# Patient Record
Sex: Male | Born: 1950 | Race: White | Hispanic: No | Marital: Married | Smoking: Never smoker
Health system: Southern US, Community
[De-identification: ages and names within clinical notes are randomized; demographics above are authoritative.]

## PROBLEM LIST (undated history)

## (undated) DIAGNOSIS — Z87442 Personal history of urinary calculi: Secondary | ICD-10-CM

## (undated) DIAGNOSIS — N289 Disorder of kidney and ureter, unspecified: Secondary | ICD-10-CM

## (undated) DIAGNOSIS — I499 Cardiac arrhythmia, unspecified: Secondary | ICD-10-CM

## (undated) HISTORY — PX: LUMBAR LAMINECTOMY: SHX95

## (undated) HISTORY — PX: BACK SURGERY: SHX140

---

## 1998-10-09 ENCOUNTER — Encounter: Payer: Self-pay | Admitting: Urology

## 1998-10-09 ENCOUNTER — Observation Stay (HOSPITAL_COMMUNITY): Admission: EM | Admit: 1998-10-09 | Discharge: 1998-10-10 | Payer: Self-pay | Admitting: Emergency Medicine

## 1999-01-12 HISTORY — PX: STONE EXTRACTION WITH BASKET: SHX5318

## 2002-01-31 ENCOUNTER — Encounter: Admission: RE | Admit: 2002-01-31 | Discharge: 2002-01-31 | Payer: Self-pay | Admitting: Neurosurgery

## 2002-01-31 ENCOUNTER — Encounter: Payer: Self-pay | Admitting: Neurosurgery

## 2011-01-14 ENCOUNTER — Encounter: Payer: Self-pay | Admitting: Emergency Medicine

## 2011-01-14 ENCOUNTER — Emergency Department (HOSPITAL_COMMUNITY): Payer: BC Managed Care – PPO

## 2011-01-14 ENCOUNTER — Emergency Department (HOSPITAL_COMMUNITY)
Admission: EM | Admit: 2011-01-14 | Discharge: 2011-01-14 | Disposition: A | Discharge status: Home / Self Care | Payer: BC Managed Care – PPO | Attending: Emergency Medicine | Admitting: Emergency Medicine

## 2011-01-14 DIAGNOSIS — N200 Calculus of kidney: Secondary | ICD-10-CM

## 2011-01-14 DIAGNOSIS — N1330 Unspecified hydronephrosis: Secondary | ICD-10-CM | POA: Insufficient documentation

## 2011-01-14 DIAGNOSIS — R109 Unspecified abdominal pain: Secondary | ICD-10-CM | POA: Insufficient documentation

## 2011-01-14 LAB — URINALYSIS, ROUTINE W REFLEX MICROSCOPIC
Bilirubin Urine: NEGATIVE
Glucose, UA: NEGATIVE mg/dL
Hgb urine dipstick: NEGATIVE
Ketones, ur: NEGATIVE mg/dL
Leukocytes, UA: NEGATIVE
Nitrite: NEGATIVE
Protein, ur: NEGATIVE mg/dL
Specific Gravity, Urine: 1.029 (ref 1.005–1.030)
Urobilinogen, UA: 0.2 mg/dL (ref 0.0–1.0)
pH: 5 (ref 5.0–8.0)

## 2011-01-14 MED ORDER — ONDANSETRON HCL 4 MG/2ML IJ SOLN
4.0000 mg | Freq: Once | INTRAMUSCULAR | Status: AC
  Administered 2011-01-14: 4 mg via INTRAVENOUS
  Filled 2011-01-14: qty 2

## 2011-01-14 MED ORDER — OXYCODONE-ACETAMINOPHEN 5-325 MG PO TABS: 2.0000 | ORAL_TABLET | ORAL | Status: AC | PRN

## 2011-01-14 MED ORDER — KETOROLAC TROMETHAMINE 30 MG/ML IJ SOLN
30.0000 mg | Freq: Once | INTRAMUSCULAR | Status: AC
  Administered 2011-01-14: 30 mg via INTRAVENOUS
  Filled 2011-01-14: qty 1

## 2011-01-14 MED ORDER — SODIUM CHLORIDE 0.9 % IV SOLN
Freq: Once | INTRAVENOUS | Status: AC
  Administered 2011-01-14: 07:00:00 via INTRAVENOUS

## 2011-01-14 MED ORDER — TAMSULOSIN HCL 0.4 MG PO CAPS: 0.4000 mg | ORAL_CAPSULE | Freq: Every day | ORAL | Status: DC

## 2011-01-14 NOTE — ED Notes (Signed)
Pt alert and oriented x4. Respirations even and unlabored, bilateral symmetrical rise and fall of chest. Skin warm and dry. In no acute distress. Denies needs. Ambulatory and verbally reports understanding discharge instructions.

## 2011-01-14 NOTE — ED Notes (Signed)
Pt alert, nad, c/o left flank pain, onset last pm, pt stats pain in waves, denies bowel or bladder, resp even unlabored, skin pwd, emesis X1 pta

## 2011-01-14 NOTE — ED Notes (Signed)
Pt alert and oriented x4. Respirations even and unlabored, bilateral symmetrical rise and fall of chest. Skin warm and dry. In no acute distress. Denies needs.  Resting in bed at present.

## 2011-01-14 NOTE — ED Notes (Signed)
Waiting for physician evaluation

## 2011-01-14 NOTE — ED Provider Notes (Signed)
History     CSN: [id number]  Arrival date & time 01/14/11  4098   First MD Initiated Contact with Patient 01/14/11 [phone]      Chief Complaint  Patient presents with  . Flank Pain    (Consider location/radiation/quality/duration/timing/severity/associated sxs/prior treatment) Patient is a 61 y.o. male presenting with flank pain. The history is provided by the patient. No language interpreter was used.  Flank Pain This is a new problem. The current episode started today. The problem occurs intermittently. The problem has been gradually worsening. Associated symptoms include abdominal pain. Pertinent negatives include no anorexia, arthralgias, change in bowel habit, chest pain, chills, congestion, coughing, diaphoresis, fatigue, fever, headaches, nausea, neck pain, numbness, rash, sore throat, urinary symptoms or vomiting. The symptoms are aggravated by nothing.    History reviewed. No pertinent past medical history.  Past Surgical History  Procedure Date  . Back surgery     No family history on file.  History  Substance Use Topics  . Smoking status: Not on file  . Smokeless tobacco: Not on file  . Alcohol Use:       Review of Systems  Constitutional: Negative for fever, chills, diaphoresis and fatigue.  HENT: Negative for congestion, sore throat and neck pain.   Respiratory: Negative for cough.   Cardiovascular: Negative for chest pain.  Gastrointestinal: Positive for abdominal pain. Negative for nausea, vomiting, anorexia and change in bowel habit.  Genitourinary: Positive for flank pain.  Musculoskeletal: Negative for arthralgias.  Skin: Negative for rash.  Neurological: Negative for numbness and headaches.  All other systems reviewed and are negative.    Allergies  Review of patient's allergies indicates no known allergies.  Home Medications   Current Outpatient Rx  Name Route Sig Dispense Refill  . ASPIRIN 325 MG PO TBEC Oral Take 325 mg by mouth daily.          BP 140/74  Pulse 60  Temp(Src) 97 F (36.1 C) (Oral)  Resp 16  Wt 185 lb (83.915 kg)  SpO2 97%  Physical Exam  Nursing note and vitals reviewed. Constitutional: He is oriented to person, place, and time. He appears well-developed and well-nourished. No distress.  HENT:  Head: Normocephalic.  Eyes: Pupils are equal, round, and reactive to light.  Neck: Normal range of motion.  Pulmonary/Chest: Effort normal.  Abdominal: He exhibits no distension. There is no tenderness. There is no rebound and no guarding.  Musculoskeletal: Normal range of motion.  Neurological: He is alert and oriented to person, place, and time.  Skin: Skin is warm and dry. He is not diaphoretic.  Psychiatric: He has a normal mood and affect.    ED Course  Procedures (including critical care time)   Labs Reviewed  URINALYSIS, ROUTINE W REFLEX MICROSCOPIC   Ct Abdomen Pelvis Wo Contrast  01/14/2011  *RADIOLOGY REPORT*  Clinical Data:  Left-sided flank pain.  CT ABDOMEN AND PELVIS WITHOUT CONTRAST (CT UROGRAM)  Technique: Contiguous axial images of the abdomen and pelvis without oral or intravenous contrast were obtained.  Comparison: None  Findings:  Exam is limited for evaluation of entities other than urinary tract calculi due to lack of oral or intravenous contrast.   Clear lung bases.  Heart size upper normal without pericardial or pleural effusion.  Tiny hiatal hernia.  Normal uninfused appearance of the liver.  A splenule.  Normal stomach, pancreas, gallbladder, biliary tract, adrenal glands.  No right-sided renal calculi or hydronephrosis.  Upper pole 6 mm left renal collecting systems  stone.  Mild left-sided hydroureteronephrosis.  This continues to the level of a 1 mm left ureter vesicular junction calculus on image 76.  No retroperitoneal or retrocrural adenopathy.  Normal colon and terminal ileum.  Appendix is not visualized but there is no evidence of right lower quadrant inflammation.  Normal small  bowel without abdominal ascites.  Mild prominence of the bilateral external iliac veins, greater right than left.  Nonspecific. No pelvic adenopathy.  Bladder otherwise normal.  Normal prostate, without significant free pelvic fluid.  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  IMPRESSION:  1.  Mild left-sided hydronephrosis, secondary to a punctate left ureterovesicular junction calculus. 2.  Left renal calculus.  Original Report Authenticated By: Consuello Bossier, M.D.     No diagnosis found.    MDM  L flank and LLQ pain x 3 hours with waves of pain.  1mm L kidney stone.  Will follow up with Patsi Sears today or tomorrow.  Strain all urine.  Percocet and flomax for pain.        Jethro Bastos, NP 01/14/11 1859

## 2011-01-15 NOTE — ED Provider Notes (Signed)
Medical screening examination/treatment/procedure(s) were performed by non-physician practitioner and as supervising physician I was immediately available for consultation/collaboration.  Livier Hendel Smitty Cords, MD 01/15/11 [phone]

## 2011-01-22 ENCOUNTER — Other Ambulatory Visit: Payer: Self-pay | Admitting: Urology

## 2011-02-01 ENCOUNTER — Encounter (HOSPITAL_COMMUNITY): Payer: Self-pay | Admitting: Pharmacy Technician

## 2011-02-02 ENCOUNTER — Encounter (HOSPITAL_COMMUNITY): Payer: Self-pay | Admitting: *Deleted

## 2011-02-02 NOTE — Progress Notes (Signed)
Instructed to fill out forms in Litho folder no vitamins, herbal medications 7 days prior no aspirin, ibuprofen as directed in blue folder. Read all pages in blue folder. Follow instructions for laxative day before plenty of fluids and light dinner .

## 2011-02-08 ENCOUNTER — Encounter (HOSPITAL_COMMUNITY): Payer: Self-pay | Admitting: *Deleted

## 2011-02-08 ENCOUNTER — Ambulatory Visit (HOSPITAL_COMMUNITY): Payer: BC Managed Care – PPO

## 2011-02-08 ENCOUNTER — Encounter (HOSPITAL_COMMUNITY)
Admission: RE | Disposition: A | Discharge status: Home / Self Care | Payer: Self-pay | Source: Ambulatory Visit | Attending: Urology

## 2011-02-08 ENCOUNTER — Ambulatory Visit (HOSPITAL_COMMUNITY)
Admission: RE | Admit: 2011-02-08 | Discharge: 2011-02-08 | Disposition: A | Discharge status: Home / Self Care | Payer: BC Managed Care – PPO | Source: Ambulatory Visit | Attending: Urology | Admitting: Urology

## 2011-02-08 DIAGNOSIS — N201 Calculus of ureter: Secondary | ICD-10-CM | POA: Insufficient documentation

## 2011-02-08 SURGERY — LITHOTRIPSY, ESWL
Anesthesia: LOCAL | Laterality: Right

## 2011-02-08 MED ORDER — DIPHENHYDRAMINE HCL 25 MG PO CAPS
25.0000 mg | ORAL_CAPSULE | ORAL | Status: AC
  Administered 2011-02-08: 25 mg via ORAL

## 2011-02-08 MED ORDER — DIAZEPAM 5 MG PO TABS
10.0000 mg | ORAL_TABLET | ORAL | Status: AC
  Administered 2011-02-08: 10 mg via ORAL

## 2011-02-08 MED ORDER — CIPROFLOXACIN HCL 500 MG PO TABS
500.0000 mg | ORAL_TABLET | ORAL | Status: AC
  Administered 2011-02-08: 500 mg via ORAL

## 2011-02-08 MED ORDER — DEXTROSE-NACL 5-0.45 % IV SOLN
INTRAVENOUS | Status: DC
  Administered 2011-02-08: 10:00:00 via INTRAVENOUS

## 2011-02-08 NOTE — Interval H&P Note (Signed)
History and Physical Interval Note:  02/08/2011 10:42 AM  Roy Sullivan  has presented today for surgery, with the diagnosis of Right Renal Stone  The various methods of treatment have been discussed with the patient and family. After consideration of risks, benefits and other options for treatment, the patient has consented to  Procedure(s): EXTRACORPOREAL SHOCK WAVE LITHOTRIPSY (ESWL) as a surgical intervention .  The patients' history has been reviewed, patient examined, no change in status, stable for surgery.  I have reviewed the patients' chart and labs.  Questions were answered to the patient's satisfaction.     [name]

## 2011-02-08 NOTE — Interval H&P Note (Signed)
History and Physical Interval Note:  02/08/2011 8:52 AM  Roy Sullivan  has presented today for surgery, with the diagnosis of Right Renal Stone  The various methods of treatment have been discussed with the patient and family. After consideration of risks, benefits and other options for treatment, the patient has consented to  Procedure(s): EXTRACORPOREAL SHOCK WAVE LITHOTRIPSY (ESWL) as a surgical intervention .  The patients' history has been reviewed, patient examined, no change in status, stable for surgery.  I have reviewed the patients' chart and labs.  Questions were answered to the patient's satisfaction.     [name]

## 2011-02-08 NOTE — H&P (Signed)
CC:  Lt flank discomfort/KUB   Active Problems Problems  1. Flank Pain Left 2. Nephrolithiasis 592.0  History of Present Illness       61 yo married male with  Lt flank discomfort.  He is leaving tomorrow to fly to [place].     He was seen in the the ER on 01/14/11 for Lt flank pain.  CT showed a 1mm Lt UVJ stone (that he has since passed) and a 6mm Lt renal stone.  He is scheduled for Lt ESWL on 02/08/11.  He has some nocturia, but no gross hematuria, and intermittent Left flank pain. No nausea or vomiting. No current testis pain.    PHYSICAL EXAM: There were no vitals taken for this visit.  General Appearance:  Alert, cooperative, no distress, appears stated age  Head:  Normocephalic, without obvious abnormality, atraumatic  Eyes:  PERRL, conjunctiva/corneas clear, EOM's intact, fundi benign, both eyes  Ears:  Normal TM's and external ear canals, both ears  Nose: Nares normal, septum midline, mucosa normal, no drainage or sinus tenderness  Throat: Lips, mucosa, and tongue normal; teeth and gums normal  Neck: Supple, symmetrical, trachea midline, no adenopathy, thyroid: not enlarged, symmetric, no tenderness/mass/nodules, no carotid bruit or JVD  Back:   Symmetric, no curvature, ROM normal, no CVA tenderness  Lungs:   Clear to auscultation bilaterally, respirations unlabored  Chest Wall:  No tenderness or deformity  Heart:  Regular rate and rhythm, S1, S2 normal, no murmur, rub or gallop  Abdomen:   Soft, non-tender, bowel sounds active all four quadrants,  no masses, no organomegaly  Genitalia:  Normal male  Rectal:  Normal tone, normal prostate, no masses or tenderness; guaiac negative stool  Extremities: Extremities normal, atraumatic, no cyanosis or edema  Pulses: 2+ and symmetric  Skin: Skin color, texture, turgor normal, no rashes or lesions  Lymph nodes: Cervical, supraclavicular, and axillary nodes normal  Neurologic: Normal     Past Medical History Problems  1. History of   Esophageal Reflux 530.81 2. History of  Nephrolithiasis V13.01  Surgical History Problems  1. History of  Back Surgery 2. History of  Back Surgery 3. History of  Cystoscopy With Manipulation Of Ureteral Calculus  Current Meds 1. Ecotrin 325 MG Oral Tablet Delayed Release; Therapy: (Recorded:11Jan2013) to 2. Sprix 15.75 MG/SPRAY Nasal Solution; INSTILL 1 SQUIRT Every 8 hours; Therapy: 17Jan2013 to  (Last Rx:17Jan2013)  Requested for: 17Jan2013  Allergies Medication  1. No Known Drug Allergies  Family History Problems  1. Family history of  Family Health Status Number Of Children 2 sons, 1 daughter 2. Family history of  Father Deceased At Age 47 3. Family history of  Mother Deceased At Age 38 4. Family history of  No Significant Family History  Social History Problems  1. Alcohol Use < 1 per day 2. Caffeine Use 2 per day 3. Marital History - Currently Married 4. Never A Smoker 5. Occupation: Therapist, sports Vital Signs [Data Includes: Last 1 Day]  17Jan2013 03:16PM  Blood Pressure: 124 / 76 Temperature: 98 F Heart Rate: 62  Results/Data  28 Jan 2011 2:51 PM   UA With REFLEX       COLOR YELLOW       APPEARANCE CLEAR       SPECIFIC GRAVITY 1.030       pH 5.5       GLUCOSE NEG       BILIRUBIN NEG       KETONE TRACE  BLOOD LARGE       PROTEIN NEG       UROBILINOGEN 0.2       NITRITE NEG       LEUKOCYTE ESTERASE NEG       SQUAMOUS EPITHELIAL/HPF RARE       WBC NONE SEEN       CRYSTALS NONE SEEN       CASTS NONE SEEN       RBC TNTC       BACTERIA NONE SEEN      Assessment Assessed  1. Flank Pain Left 2. Nephrolithiasis 592.0   KUB shows 2 possible stones in the L Lower pelvis, and there is stool overlying the L kidney. Kn own 6mm L renal stone.  Plan  RTC for lithotripsy of Left renal stone. KUB pre-op.  Signatures Electronically signed by : Jethro Bolus, M.D.; Jan 28 2011  3:39PM

## 2011-02-09 ENCOUNTER — Encounter (HOSPITAL_COMMUNITY): Payer: Self-pay

## 2012-04-11 ENCOUNTER — Encounter (HOSPITAL_COMMUNITY): Payer: Self-pay | Admitting: Emergency Medicine

## 2012-04-11 ENCOUNTER — Emergency Department (HOSPITAL_COMMUNITY)
Admission: EM | Admit: 2012-04-11 | Discharge: 2012-04-12 | Disposition: A | Discharge status: Home / Self Care | Payer: BC Managed Care – PPO | Attending: Urology | Admitting: Urology

## 2012-04-11 ENCOUNTER — Emergency Department (HOSPITAL_COMMUNITY): Payer: BC Managed Care – PPO

## 2012-04-11 DIAGNOSIS — N1330 Unspecified hydronephrosis: Secondary | ICD-10-CM | POA: Insufficient documentation

## 2012-04-11 DIAGNOSIS — Z87442 Personal history of urinary calculi: Secondary | ICD-10-CM | POA: Insufficient documentation

## 2012-04-11 DIAGNOSIS — N201 Calculus of ureter: Secondary | ICD-10-CM | POA: Insufficient documentation

## 2012-04-11 MED ORDER — ONDANSETRON HCL 4 MG/2ML IJ SOLN
4.0000 mg | Freq: Once | INTRAMUSCULAR | Status: AC
  Administered 2012-04-11: 4 mg via INTRAVENOUS
  Filled 2012-04-11: qty 2

## 2012-04-11 MED ORDER — DEXTROSE-NACL 5-0.45 % IV SOLN
INTRAVENOUS | Status: DC
  Administered 2012-04-11: 23:00:00 via INTRAVENOUS

## 2012-04-11 MED ORDER — KETOROLAC TROMETHAMINE 30 MG/ML IJ SOLN
30.0000 mg | Freq: Once | INTRAMUSCULAR | Status: AC
  Administered 2012-04-11: 30 mg via INTRAVENOUS
  Filled 2012-04-11: qty 1

## 2012-04-11 NOTE — ED Notes (Signed)
Pt states that he has had L sided flank pain x 3 hrs. Known kidney stone hx. [facility]enbaum called with orders. No blood in urine.

## 2012-04-11 NOTE — Consult Note (Signed)
Urology Consult  Referring physician:  Dr. Trula Slade Reason for referral:   Flank pain/kidney stone  Chief Complaint:   Left flank pain  History of Present Illness:   62 yo male with several day hx of increasing L flank pain and general malaise, with pain radiating around to there LLQ. He has had intensifying pain tonight,and is seen in [facility] ED. No gross hematuria, no frequency, no stranguria, no hesitancy, but early nausea without vomiting. 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   Past Medical History  Diagnosis Date  . Kidney stones     multiple kidney stones in the past   Past Surgical History  Procedure Laterality Date  . Back surgery      1989 and1994  . Stone extraction with basket  2001    Medications: I have reviewed the patient's current medications. Allergies: No Known Allergies  History reviewed. No pertinent family history. Social History:  reports that he has never smoked. He does not have any smokeless tobacco history on file. He reports that  drinks alcohol. He reports that he does not use illicit drugs.  ROS: All systems are reviewed and negative except as noted. L flank pain, radiating LLQ. No testis pain. No nausea, no vomiting.   Physical Exam:  Vital signs in last 24 hours: Temp:  [97.7 F (36.5 C)] 97.7 F (36.5 C) (04/01 2219) Pulse Rate:  [51] 51 (04/01 2219) BP: (136)/(56) 136/56 mmHg (04/01 2219) SpO2:  [100 %] 100 % (04/01 2219)  Cardiovascular: Skin warm; not flushed Respiratory: Breaths quiet; no shortness of breath Abdomen: No masses. Decreased BS. L flank pain, mild.  Neurological: Normal sensation to touch Musculoskeletal: Normal motor function arms and legs Lymphatics: No inguinal adenopathy Skin: No rashes Genitourinary: wnl.  Laboratory Data:  No results found for this or any previous visit (from the past 72 hour(s)). No results found for this or any previous visit (from the past 240  hour(s)). Creatinine: No results found for this basename: CREATININE,  in the last 168 hours  Xrays: Clinical Data: Left flank pain  CT ABDOMEN AND PELVIS WITHOUT CONTRAST  Technique: Multidetector CT imaging of the abdomen and pelvis was  performed following the standard protocol without intravenous  contrast.  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 The largest portion is 2.2 x 2.2 cm. The abnormality is  partially imaged. Minimal calcification in the left anterior  descending coronary artery.  Mild left hydronephrosis is associated with a 6 mm left ureteral  vesicle junction calculus. No right hydronephrosis. No renal  calculi.  Gallbladder is decompressed  Sub centimeter tiny hypodensity in the right lobe of the liver has  a benign appearance.  Spleen, pancreas, adrenal glands are within normal limits.  Advanced degenerative disc disease at L4-5 and L5-S1.  No free fluid.  Unremarkable prostate.  IMPRESSION:  6 mm left ureteral vesicle junction calculus is associated with  mild left hydronephrosis.  There is a calcified mass anterior to the right heart chambers.  The abnormality is partially imaged. This may represent a  calcified soft tissue mass or aneurysm such as coronary artery  aneurysm. Contrast enhanced CT chest is recommended when feasible.  Original Report Authenticated By: Jolaine Click, M.D.    Impression/Assessment:   1. 6mm L u-v junction stone. Mild hydronephrosis. Better post toradol/zofran .  He will strain urine, and may be discharged. We have discussed options, including immediate extraction of stone, observation of stone with delayed surgery,  Lithotripsy, and medical expulsive therapy. We will try MET first, with toradol, Rapaflo, and pain med, and urine straining.    Plan:  1. Rapaflo 2. RTC in AM for another toradol shot prn.   3. Strain urine 4. Percocet as needed.   Markeshia Giebel I 04/11/2012, 11:43 PM

## 2012-04-12 MED ORDER — SILODOSIN 8 MG PO CAPS: 8.0000 mg | ORAL_CAPSULE | Freq: Every day | ORAL | Status: DC

## 2012-04-14 ENCOUNTER — Other Ambulatory Visit: Payer: Self-pay | Admitting: Internal Medicine

## 2012-04-14 DIAGNOSIS — R222 Localized swelling, mass and lump, trunk: Secondary | ICD-10-CM

## 2012-04-17 ENCOUNTER — Ambulatory Visit
Admission: RE | Admit: 2012-04-17 | Discharge: 2012-04-17 | Disposition: A | Discharge status: Home / Self Care | Payer: BC Managed Care – PPO | Source: Ambulatory Visit | Attending: Internal Medicine | Admitting: Internal Medicine

## 2012-04-17 DIAGNOSIS — R222 Localized swelling, mass and lump, trunk: Secondary | ICD-10-CM

## 2012-04-17 MED ORDER — IOHEXOL 300 MG/ML  SOLN
75.0000 mL | Freq: Once | INTRAMUSCULAR | Status: AC | PRN
  Administered 2012-04-17: 75 mL via INTRAVENOUS

## 2012-04-27 ENCOUNTER — Encounter (HOSPITAL_COMMUNITY): Payer: Self-pay | Admitting: Pharmacy Technician

## 2012-04-27 ENCOUNTER — Encounter (HOSPITAL_COMMUNITY): Payer: Self-pay | Admitting: *Deleted

## 2012-04-27 ENCOUNTER — Other Ambulatory Visit: Payer: Self-pay | Admitting: Urology

## 2012-05-01 ENCOUNTER — Encounter (HOSPITAL_COMMUNITY)
Admission: RE | Disposition: A | Discharge status: Home / Self Care | Payer: Self-pay | Source: Ambulatory Visit | Attending: Urology

## 2012-05-01 ENCOUNTER — Encounter (HOSPITAL_COMMUNITY): Payer: Self-pay | Admitting: General Practice

## 2012-05-01 ENCOUNTER — Ambulatory Visit (HOSPITAL_COMMUNITY): Payer: BC Managed Care – PPO

## 2012-05-01 ENCOUNTER — Ambulatory Visit (HOSPITAL_COMMUNITY)
Admission: RE | Admit: 2012-05-01 | Discharge: 2012-05-01 | Disposition: A | Discharge status: Home / Self Care | Payer: BC Managed Care – PPO | Source: Ambulatory Visit | Attending: Urology | Admitting: Urology

## 2012-05-01 ENCOUNTER — Encounter (HOSPITAL_COMMUNITY): Payer: Self-pay | Admitting: Anesthesiology

## 2012-05-01 ENCOUNTER — Ambulatory Visit (HOSPITAL_COMMUNITY): Payer: BC Managed Care – PPO | Admitting: Anesthesiology

## 2012-05-01 DIAGNOSIS — N1330 Unspecified hydronephrosis: Secondary | ICD-10-CM | POA: Insufficient documentation

## 2012-05-01 DIAGNOSIS — Z87442 Personal history of urinary calculi: Secondary | ICD-10-CM | POA: Insufficient documentation

## 2012-05-01 DIAGNOSIS — N201 Calculus of ureter: Secondary | ICD-10-CM | POA: Insufficient documentation

## 2012-05-01 DIAGNOSIS — N132 Hydronephrosis with renal and ureteral calculous obstruction: Secondary | ICD-10-CM

## 2012-05-01 HISTORY — PX: HOLMIUM LASER APPLICATION: SHX5852

## 2012-05-01 HISTORY — DX: Personal history of urinary calculi: Z87.442

## 2012-05-01 HISTORY — PX: CYSTOSCOPY/RETROGRADE/URETEROSCOPY/STONE EXTRACTION WITH BASKET: SHX5317

## 2012-05-01 LAB — SURGICAL PCR SCREEN
MRSA, PCR: NEGATIVE
Staphylococcus aureus: POSITIVE — AB

## 2012-05-01 SURGERY — CYSTOSCOPY, WITH CALCULUS REMOVAL USING BASKET
Anesthesia: General | Site: Ureter | Laterality: Left | Wound class: Clean Contaminated

## 2012-05-01 MED ORDER — IOHEXOL 300 MG/ML  SOLN
INTRAMUSCULAR | Status: AC
  Filled 2012-05-01: qty 1

## 2012-05-01 MED ORDER — OXYCODONE HCL 5 MG/5ML PO SOLN
5.0000 mg | Freq: Once | ORAL | Status: DC | PRN
  Filled 2012-05-01: qty 5

## 2012-05-01 MED ORDER — KETOROLAC TROMETHAMINE 30 MG/ML IJ SOLN
INTRAMUSCULAR | Status: DC | PRN
  Administered 2012-05-01: 30 mg via INTRAVENOUS

## 2012-05-01 MED ORDER — CEFAZOLIN SODIUM 1-5 GM-% IV SOLN
1.0000 g | INTRAVENOUS | Status: AC
  Administered 2012-05-01: 2 g via INTRAVENOUS

## 2012-05-01 MED ORDER — LACTATED RINGERS IV SOLN: INTRAVENOUS | Status: DC

## 2012-05-01 MED ORDER — FENTANYL CITRATE 0.05 MG/ML IJ SOLN
INTRAMUSCULAR | Status: DC | PRN
  Administered 2012-05-01 (×3): 50 ug via INTRAVENOUS

## 2012-05-01 MED ORDER — OXYCODONE-ACETAMINOPHEN 5-325 MG PO TABS: 1.0000 | ORAL_TABLET | Freq: Four times a day (QID) | ORAL | Status: DC | PRN

## 2012-05-01 MED ORDER — CEFAZOLIN SODIUM-DEXTROSE 2-3 GM-% IV SOLR
INTRAVENOUS | Status: AC
  Filled 2012-05-01: qty 50

## 2012-05-01 MED ORDER — MUPIROCIN 2 % EX OINT
TOPICAL_OINTMENT | Freq: Two times a day (BID) | CUTANEOUS | Status: DC
  Administered 2012-05-01: 16:00:00 via NASAL
  Filled 2012-05-01: qty 22

## 2012-05-01 MED ORDER — MEPERIDINE HCL 50 MG/ML IJ SOLN: 6.2500 mg | INTRAMUSCULAR | Status: DC | PRN

## 2012-05-01 MED ORDER — PROMETHAZINE HCL 25 MG/ML IJ SOLN: 6.2500 mg | INTRAMUSCULAR | Status: DC | PRN

## 2012-05-01 MED ORDER — LIDOCAINE HCL (PF) 2 % IJ SOLN
INTRAMUSCULAR | Status: DC | PRN
  Administered 2012-05-01: 75 mg

## 2012-05-01 MED ORDER — HYDROMORPHONE HCL PF 1 MG/ML IJ SOLN: 0.2500 mg | INTRAMUSCULAR | Status: DC | PRN

## 2012-05-01 MED ORDER — ACETAMINOPHEN 10 MG/ML IV SOLN
INTRAVENOUS | Status: DC | PRN
  Administered 2012-05-01: 1000 mg via INTRAVENOUS

## 2012-05-01 MED ORDER — 0.9 % SODIUM CHLORIDE (POUR BTL) OPTIME
TOPICAL | Status: DC | PRN
  Administered 2012-05-01: 1000 mL

## 2012-05-01 MED ORDER — IOHEXOL 300 MG/ML  SOLN
INTRAMUSCULAR | Status: DC | PRN
  Administered 2012-05-01: 1 mL

## 2012-05-01 MED ORDER — BELLADONNA ALKALOIDS-OPIUM 16.2-60 MG RE SUPP
RECTAL | Status: DC | PRN
  Administered 2012-05-01: 1 via RECTAL

## 2012-05-01 MED ORDER — ACETAMINOPHEN 10 MG/ML IV SOLN
INTRAVENOUS | Status: AC
  Filled 2012-05-01: qty 100

## 2012-05-01 MED ORDER — LIDOCAINE HCL 2 % EX GEL
CUTANEOUS | Status: AC
  Filled 2012-05-01: qty 10

## 2012-05-01 MED ORDER — ACETAMINOPHEN 10 MG/ML IV SOLN: 1000.0000 mg | Freq: Once | INTRAVENOUS | Status: DC | PRN

## 2012-05-01 MED ORDER — LACTATED RINGERS IV SOLN
INTRAVENOUS | Status: DC | PRN
  Administered 2012-05-01: 17:00:00 via INTRAVENOUS

## 2012-05-01 MED ORDER — URELLE 81 MG PO TABS: 1.0000 | ORAL_TABLET | Freq: Three times a day (TID) | ORAL | Status: DC

## 2012-05-01 MED ORDER — ONDANSETRON HCL 4 MG/2ML IJ SOLN
INTRAMUSCULAR | Status: DC | PRN
  Administered 2012-05-01: 4 mg via INTRAVENOUS

## 2012-05-01 MED ORDER — CIPROFLOXACIN HCL 500 MG PO TABS: 500.0000 mg | ORAL_TABLET | Freq: Two times a day (BID) | ORAL | Status: DC

## 2012-05-01 MED ORDER — SODIUM CHLORIDE 0.9 % IR SOLN
Status: DC | PRN
  Administered 2012-05-01: 6000 mL

## 2012-05-01 MED ORDER — BELLADONNA ALKALOIDS-OPIUM 16.2-60 MG RE SUPP
RECTAL | Status: AC
  Filled 2012-05-01: qty 1

## 2012-05-01 MED ORDER — LIDOCAINE HCL 2 % EX GEL
CUTANEOUS | Status: DC | PRN
  Administered 2012-05-01: 1

## 2012-05-01 MED ORDER — OXYCODONE HCL 5 MG PO TABS: 5.0000 mg | ORAL_TABLET | Freq: Once | ORAL | Status: DC | PRN

## 2012-05-01 MED ORDER — PROPOFOL 10 MG/ML IV BOLUS
INTRAVENOUS | Status: DC | PRN
  Administered 2012-05-01: 200 mg via INTRAVENOUS

## 2012-05-01 SURGICAL SUPPLY — 22 items
ADAPTER CATH URET PLST 4-6FR (CATHETERS) ×2 IMPLANT
BAG URO CATCHER STRL LF (DRAPE) ×2 IMPLANT
BASKET STNLS GEMINI 4WIRE 3FR (BASKET) ×2 IMPLANT
BASKET ZERO TIP NITINOL 2.4FR (BASKET) IMPLANT
CATH CLEAR GEL 3F BACKSTOP (CATHETERS) ×2 IMPLANT
CATH INTERMIT  6FR 70CM (CATHETERS) ×2 IMPLANT
CATH URET 5FR 28IN OPEN ENDED (CATHETERS) ×2 IMPLANT
CLOTH BEACON ORANGE TIMEOUT ST (SAFETY) ×2 IMPLANT
DRAPE CAMERA CLOSED 9X96 (DRAPES) ×2 IMPLANT
GLOVE BIOGEL M STRL SZ7.5 (GLOVE) ×2 IMPLANT
GLOVE BIOGEL PI IND STRL 6 (GLOVE) ×1 IMPLANT
GLOVE BIOGEL PI INDICATOR 6 (GLOVE) ×1
GLOVE SURG SS PI 8.0 STRL IVOR (GLOVE) ×2 IMPLANT
GOWN PREVENTION PLUS XLARGE (GOWN DISPOSABLE) ×2 IMPLANT
GOWN STRL REIN XL XLG (GOWN DISPOSABLE) ×2 IMPLANT
GUIDEWIRE STR DUAL SENSOR (WIRE) ×2 IMPLANT
LASER FIBER DISP (UROLOGICAL SUPPLIES) ×2 IMPLANT
MANIFOLD NEPTUNE II (INSTRUMENTS) ×2 IMPLANT
MARKER SKIN DUAL TIP RULER LAB (MISCELLANEOUS) ×2 IMPLANT
PACK CYSTO (CUSTOM PROCEDURE TRAY) ×2 IMPLANT
SCRUB PCMX 4 OZ (MISCELLANEOUS) ×2 IMPLANT
TUBING CONNECTING 10 (TUBING) ×2 IMPLANT

## 2012-05-01 NOTE — Anesthesia Postprocedure Evaluation (Signed)
Anesthesia Post Note  Patient: Roy Sullivan  Procedure(s) Performed: Procedure(s) (LRB): CYSTOSCOPY/LEFT RETROGRADE PYELOGRAM/LEFT URETEROSCOPY/STONE EXTRACTION WITH BASKET (Left) HOLMIUM LASER APPLICATION (Left)  Anesthesia type: General  Patient location: PACU  Post pain: Pain level controlled  Post assessment: Post-op Vital signs reviewed  Last Vitals: BP 118/62  Pulse 60  Temp(Src) 36.4 C (Oral)  Resp 14  Ht 5\' 9"  (1.753 m)  Wt 185 lb (83.915 kg)  BMI 27.31 kg/m2  SpO2 100%  Post vital signs: Reviewed  Level of consciousness: sedated  Complications: No apparent anesthesia complications

## 2012-05-01 NOTE — Op Note (Signed)
Pre-operative diagnosis : Inpisated non-progressive symptomatic 6mm Left uretero-vesical junction stone.   Postoperative diagnosis:   Same  Operation:  Cystourethroscopy, left retrograde PolyGram interpretation, instillation of backstop in left lower ureter, laser fragmentation of left lower ureteral calculus, basket extraction of left lower ureter calculus fragments.  Surgeon:  Kathie Rhodes. Patsi Sears, MD  First assistant:  None  Anesthesia:  general  Preparation:  After appropriate preanesthesia, the patient was brought to the operating room, placed on the operating table in the dorsal supine position where general LMA anesthesia was introduced. He was then replaced in the dorsal lithotomy position with pubis was prepped with Betadine solution and draped in usual fashion. The arm band was double checked.  Review history:  History of Present Illness: 62 yo male with several day hx of increasing L flank pain and general malaise, with pain radiating around to there LLQ. He has had intensifying pain tonight,and is seen in [facility] ED. No gross hematuria, no frequency, no stranguria, no hesitancy, but early nausea without vomiting. 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 Now with 6mm inspissated  L U-V junction stone, symptomatic.   Past Medical History  Diagnosis Date  . Kidney stones  multiple kidney stones in the past  Past Surgical History  Procedure Laterality Date  . Back surgery  1989 and1994  . Stone extraction with basket 2001      Statement of  Likelihood of Success: Excellent. TIME-OUT observed.:  Procedure:  Cystourethroscopy was accomplished. The urethra was normal. The bladder neck and prostate were normal. There was no trabeculation, no stone formation, no bladder stone and no bladder tumor formation noted. There was clear reflux from the right  orifice. Left retrograde Pyelolgram was performed through a normal appearing ureteral orifice,  which was located in normal position on the trigone. Retrograde pyelogram revealed a left ureterovesical junction stone, which was the same as the 6 mm stone identified on CT scan. There was dilated ureter above the stone.  Through a separate catheter, Backstop was injected 1cm above the level of the stone.  An 0.038 guidewire was then passed around the stone into the renal pelvis. The cystoscope was then removed, and the short 6 French ureteroscope was placed. Stone was directly visualized. The 365  laser fiber was placed, and with settings of 0.5/20, and  the stone was fragmented. The stone was multilobular in  appearance, with a large " tooth" projecting from the rear of the stone.  The stone was fragmented into 3 pieces, and each piece was removed with the basket. Following this, the ureter appeared to be slightly hyperemic. I elected to not place a double-J stent. Remaining ureter was normal. The ureteroscope was removed. The Backstop was allowed to drain. The safety wire was removed. The bladder was drained of fluid. The patient received transurethral xylocaine gel. He also received IV Toradol at the end of procedure, IV Tylenol at the beginning of procedure, as well as a B. and O. suppository. He was awakened and taken to recovery room in good condition.

## 2012-05-01 NOTE — Anesthesia Preprocedure Evaluation (Addendum)
Anesthesia Evaluation  Patient identified by MRN, date of birth, ID band Patient awake    Reviewed: Allergy & Precautions, H&P , NPO status , Patient's Chart, lab work & pertinent test results  Airway Mallampati: I TM Distance: >3 FB Neck ROM: Full    Dental  (+) Dental Advisory Given and Teeth Intact   Pulmonary neg pulmonary ROS,  breath sounds clear to auscultation        Cardiovascular negative cardio ROS  Rhythm:Regular Rate:Normal     Neuro/Psych negative neurological ROS  negative psych ROS   GI/Hepatic negative GI ROS, Neg liver ROS,   Endo/Other  negative endocrine ROS  Renal/GU negative Renal ROS     Musculoskeletal negative musculoskeletal ROS (+)   Abdominal (+) - obese,   Peds  Hematology negative hematology ROS (+)   Anesthesia Other Findings   Reproductive/Obstetrics                          Anesthesia Physical Anesthesia Plan  ASA: I  Anesthesia Plan: General   Post-op Pain Management:    Induction: Intravenous  Airway Management Planned: LMA  Additional Equipment:   Intra-op Plan:   Post-operative Plan: Extubation in OR  Informed Consent: I have reviewed the patients History and Physical, chart, labs and discussed the procedure including the risks, benefits and alternatives for the proposed anesthesia with the patient or authorized representative who has indicated his/her understanding and acceptance.   Dental advisory given  Plan Discussed with: CRNA  Anesthesia Plan Comments:         Anesthesia Quick Evaluation

## 2012-05-01 NOTE — H&P (Signed)
Urology Consult   Referring physician:  Dr. Trula Slade Reason for referral:   Flank pain/kidney stone   Chief Complaint:   Left flank pain   History of Present Illness:   62 yo male with several day hx of increasing L flank pain and general malaise, with pain radiating around to there LLQ. He has had intensifying pain tonight,and is seen in [facility] ED. No gross hematuria, no frequency, no stranguria, no hesitancy, but early nausea without vomiting. 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     Past Medical History   Diagnosis  Date   .  Kidney stones         multiple kidney stones in the past    Past Surgical History   Procedure  Laterality  Date   .  Back surgery           1989 and1994   .  Stone extraction with basket    2001      Medications: I have reviewed the patient's current medications. Allergies: No Known Allergies   History reviewed. No pertinent family history. Social History: reports that he has never smoked. He does not have any smokeless tobacco history on file. He reports that  drinks alcohol. He reports that he does not use illicit drugs.   ROS: All systems are reviewed and negative except as noted. L flank pain, radiating LLQ. No testis pain. No nausea, no vomiting.    Physical Exam:   Vital signs in last 24 hours: Temp:  [97.7 F (36.5 C)] 97.7 F (36.5 C) (04/01 2219) Pulse Rate:  [51] 51 (04/01 2219) BP: (136)/(56) 136/56 mmHg (04/01 2219) SpO2:  [100 %] 100 % (04/01 2219)   Cardiovascular: Skin warm; not flushed Respiratory: Breaths quiet; no shortness of breath Abdomen: No masses. Decreased BS. L flank pain, mild.   Neurological: Normal sensation to touch Musculoskeletal: Normal motor function arms and legs Lymphatics: No inguinal adenopathy Skin: No rashes Genitourinary: wnl.   Laboratory Data:   No results found for this or any previous visit (from the past 72 hour(s)). No results found for  this or any previous visit (from the past 240 hour(s)). Creatinine: No results found for this basename: CREATININE,  in the last 168 hours   Xrays: Clinical Data: Left flank pain   CT ABDOMEN AND PELVIS WITHOUT CONTRAST   Technique: Multidetector CT imaging of the abdomen and pelvis was   performed following the standard protocol without intravenous   contrast.   Comparison: 01/14/2011   Findings: Limited images through the lower chest and mediastinum   demonstrate a calcified lobulated mass anterior to the right heart   chambers. The largest portion is 2.2 x 2.2 cm. The abnormality is   partially imaged. Minimal calcification in the left anterior   descending coronary artery.   Mild left hydronephrosis is associated with a 6 mm left ureteral   vesicle junction calculus. No right hydronephrosis. No renal   calculi.   Gallbladder is decompressed   Sub centimeter tiny hypodensity in the right lobe of the liver has   a benign appearance.   Spleen, pancreas, adrenal glands are within normal limits.   Advanced degenerative disc disease at L4-5 and L5-S1.   No free fluid.   Unremarkable prostate.   IMPRESSION:   6 mm left ureteral vesicle junction calculus is associated with   mild left hydronephrosis.   There is a calcified mass  anterior to the right heart chambers.   The abnormality is partially imaged. This may represent a   calcified soft tissue mass or aneurysm such as coronary artery   aneurysm. Contrast enhanced CT chest is recommended when feasible.   Original Report Authenticated By: Jolaine Click, M.D.      Impression/Assessment:    1. 6mm L u-v junction stone. Mild hydronephrosis. Better post toradol/zofran . He will strain urine, and may be discharged. We have discussed options, including immediate extraction of stone, observation of stone with delayed surgery,  Lithotripsy, and medical expulsive therapy. We will try MET first, with toradol, Rapaflo, and pain med, and  urine straining.     Plan:   1. Rapaflo 2. RTC in AM for another toradol shot prn.   3. Strain urine 4. Percocet as needed.    Tanish Prien I 04/11/2012, 11:43 PM   Pt has been unable to pass stone, despite medical expulsive therapy with alpha blocker and IM toradol. Now for extirpation of stone.  .ACE inhibitor therapy was not prescribed due to medical contraindication ( not indicated-cardiology evaluation pending).          Routing History...     Date/Time From To Method   04/11/2012 11:58 PM Kathi Ludwig, MD Kathi Ludwig, MD Fax

## 2012-05-01 NOTE — Interval H&P Note (Signed)
History and Physical Interval Note:  05/01/2012 5:42 PM  Roy Sullivan  has presented today for surgery, with the diagnosis of Left Ureteral Vesicle Junction Calculus  The various methods of treatment have been discussed with the patient and family. After consideration of risks, benefits and other options for treatment, the patient has consented to  Procedure(s) with comments: CYSTOSCOPY/RETROGRADE/URETEROSCOPY/STONE EXTRACTION WITH BASKET (Left) - **ADD ON CASE FOR 05/01/12**  90 mins req for this case  STONE BASKET EXTRACTION POSSIBLE JJ STENT PLACEMENT  C-ARM  HOLMIUM LASER APPLICATION (Left) - FRAGMENTATION  as a surgical intervention .  The patient's history has been reviewed, patient examined, no change in status, stable for surgery.  I have reviewed the patient's chart and labs.  Questions were answered to the patient's satisfaction.     [name]

## 2012-05-01 NOTE — Transfer of Care (Signed)
Immediate Anesthesia Transfer of Care Note  Patient: Roy Sullivan  Procedure(s) Performed: Procedure(s) with comments: CYSTOSCOPY/LEFT RETROGRADE PYELOGRAM/LEFT URETEROSCOPY/STONE EXTRACTION WITH BASKET (Left) - **ADD ON CASE FOR 05/01/12**  90 mins req for this case  STONE BASKET EXTRACTION POSSIBLE JJ STENT PLACEMENT  C-ARM  HOLMIUM LASER APPLICATION (Left) - FRAGMENTATION   Patient Location: PACU  Anesthesia Type:General  Level of Consciousness: awake, alert , oriented and patient cooperative  Airway & Oxygen Therapy: Patient Spontanous Breathing and Patient connected to face mask oxygen  Post-op Assessment: Report given to PACU RN and Post -op Vital signs reviewed and stable  Post vital signs: Reviewed and stable  Complications: No apparent anesthesia complications

## 2012-05-02 ENCOUNTER — Encounter (HOSPITAL_COMMUNITY): Payer: Self-pay | Admitting: Urology

## 2015-09-11 DIAGNOSIS — H16042 Marginal corneal ulcer, left eye: Secondary | ICD-10-CM | POA: Diagnosis not present

## 2015-09-18 DIAGNOSIS — H16202 Unspecified keratoconjunctivitis, left eye: Secondary | ICD-10-CM | POA: Diagnosis not present

## 2015-10-06 DIAGNOSIS — Z23 Encounter for immunization: Secondary | ICD-10-CM | POA: Diagnosis not present

## 2015-11-13 DIAGNOSIS — Z23 Encounter for immunization: Secondary | ICD-10-CM | POA: Diagnosis not present

## 2015-11-13 DIAGNOSIS — R69 Illness, unspecified: Secondary | ICD-10-CM | POA: Diagnosis not present

## 2015-11-13 DIAGNOSIS — Z0000 Encounter for general adult medical examination without abnormal findings: Secondary | ICD-10-CM | POA: Diagnosis not present

## 2015-11-13 DIAGNOSIS — Z125 Encounter for screening for malignant neoplasm of prostate: Secondary | ICD-10-CM | POA: Diagnosis not present

## 2015-11-13 DIAGNOSIS — R748 Abnormal levels of other serum enzymes: Secondary | ICD-10-CM | POA: Diagnosis not present

## 2015-11-13 DIAGNOSIS — E7800 Pure hypercholesterolemia, unspecified: Secondary | ICD-10-CM | POA: Diagnosis not present

## 2015-11-26 DIAGNOSIS — R799 Abnormal finding of blood chemistry, unspecified: Secondary | ICD-10-CM | POA: Diagnosis not present

## 2016-03-30 DIAGNOSIS — R69 Illness, unspecified: Secondary | ICD-10-CM | POA: Diagnosis not present

## 2016-08-30 DIAGNOSIS — H52203 Unspecified astigmatism, bilateral: Secondary | ICD-10-CM | POA: Diagnosis not present

## 2016-08-30 DIAGNOSIS — H524 Presbyopia: Secondary | ICD-10-CM | POA: Diagnosis not present

## 2016-08-30 DIAGNOSIS — H5213 Myopia, bilateral: Secondary | ICD-10-CM | POA: Diagnosis not present

## 2016-10-07 DIAGNOSIS — Z23 Encounter for immunization: Secondary | ICD-10-CM | POA: Diagnosis not present

## 2016-11-24 ENCOUNTER — Other Ambulatory Visit: Payer: Self-pay | Admitting: Internal Medicine

## 2016-11-24 DIAGNOSIS — Z125 Encounter for screening for malignant neoplasm of prostate: Secondary | ICD-10-CM | POA: Diagnosis not present

## 2016-11-24 DIAGNOSIS — Z0000 Encounter for general adult medical examination without abnormal findings: Secondary | ICD-10-CM | POA: Diagnosis not present

## 2016-11-24 DIAGNOSIS — J8410 Pulmonary fibrosis, unspecified: Secondary | ICD-10-CM | POA: Diagnosis not present

## 2016-11-24 DIAGNOSIS — N182 Chronic kidney disease, stage 2 (mild): Secondary | ICD-10-CM | POA: Diagnosis not present

## 2016-11-24 DIAGNOSIS — Z1389 Encounter for screening for other disorder: Secondary | ICD-10-CM | POA: Diagnosis not present

## 2016-11-24 DIAGNOSIS — E7800 Pure hypercholesterolemia, unspecified: Secondary | ICD-10-CM | POA: Diagnosis not present

## 2016-11-30 ENCOUNTER — Ambulatory Visit
Admission: RE | Admit: 2016-11-30 | Discharge: 2016-11-30 | Disposition: A | Discharge status: Home / Self Care | Payer: Medicare HMO | Source: Ambulatory Visit | Attending: Internal Medicine | Admitting: Internal Medicine

## 2016-11-30 DIAGNOSIS — R918 Other nonspecific abnormal finding of lung field: Secondary | ICD-10-CM | POA: Diagnosis not present

## 2016-11-30 DIAGNOSIS — J8410 Pulmonary fibrosis, unspecified: Secondary | ICD-10-CM

## 2016-12-28 DIAGNOSIS — R69 Illness, unspecified: Secondary | ICD-10-CM | POA: Diagnosis not present

## 2017-02-03 DIAGNOSIS — Z1211 Encounter for screening for malignant neoplasm of colon: Secondary | ICD-10-CM | POA: Diagnosis not present

## 2017-02-03 DIAGNOSIS — K5730 Diverticulosis of large intestine without perforation or abscess without bleeding: Secondary | ICD-10-CM | POA: Diagnosis not present

## 2017-02-22 DIAGNOSIS — R69 Illness, unspecified: Secondary | ICD-10-CM | POA: Diagnosis not present

## 2017-03-01 DIAGNOSIS — R69 Illness, unspecified: Secondary | ICD-10-CM | POA: Diagnosis not present

## 2017-09-20 DIAGNOSIS — R69 Illness, unspecified: Secondary | ICD-10-CM | POA: Diagnosis not present

## 2017-10-10 DIAGNOSIS — Z23 Encounter for immunization: Secondary | ICD-10-CM | POA: Diagnosis not present

## 2017-11-25 DIAGNOSIS — E7800 Pure hypercholesterolemia, unspecified: Secondary | ICD-10-CM | POA: Diagnosis not present

## 2017-11-25 DIAGNOSIS — Z23 Encounter for immunization: Secondary | ICD-10-CM | POA: Diagnosis not present

## 2017-11-25 DIAGNOSIS — R0989 Other specified symptoms and signs involving the circulatory and respiratory systems: Secondary | ICD-10-CM | POA: Diagnosis not present

## 2017-11-25 DIAGNOSIS — Z1389 Encounter for screening for other disorder: Secondary | ICD-10-CM | POA: Diagnosis not present

## 2017-11-25 DIAGNOSIS — Z125 Encounter for screening for malignant neoplasm of prostate: Secondary | ICD-10-CM | POA: Diagnosis not present

## 2017-11-25 DIAGNOSIS — N182 Chronic kidney disease, stage 2 (mild): Secondary | ICD-10-CM | POA: Diagnosis not present

## 2017-11-25 DIAGNOSIS — N529 Male erectile dysfunction, unspecified: Secondary | ICD-10-CM | POA: Diagnosis not present

## 2017-11-25 DIAGNOSIS — Z0001 Encounter for general adult medical examination with abnormal findings: Secondary | ICD-10-CM | POA: Diagnosis not present

## 2017-12-07 DIAGNOSIS — R1313 Dysphagia, pharyngeal phase: Secondary | ICD-10-CM | POA: Diagnosis not present

## 2018-01-12 DIAGNOSIS — R69 Illness, unspecified: Secondary | ICD-10-CM | POA: Diagnosis not present

## 2018-03-20 DIAGNOSIS — H5213 Myopia, bilateral: Secondary | ICD-10-CM | POA: Diagnosis not present

## 2018-03-20 DIAGNOSIS — H52203 Unspecified astigmatism, bilateral: Secondary | ICD-10-CM | POA: Diagnosis not present

## 2018-03-20 DIAGNOSIS — H2513 Age-related nuclear cataract, bilateral: Secondary | ICD-10-CM | POA: Diagnosis not present

## 2018-03-20 DIAGNOSIS — H524 Presbyopia: Secondary | ICD-10-CM | POA: Diagnosis not present

## 2018-05-14 ENCOUNTER — Other Ambulatory Visit: Payer: Self-pay

## 2018-05-14 ENCOUNTER — Encounter (HOSPITAL_COMMUNITY): Payer: Self-pay

## 2018-05-14 ENCOUNTER — Emergency Department (HOSPITAL_COMMUNITY)
Admission: EM | Admit: 2018-05-14 | Discharge: 2018-05-14 | Disposition: A | Discharge status: Home / Self Care | Payer: Medicare HMO | Attending: Emergency Medicine | Admitting: Emergency Medicine

## 2018-05-14 ENCOUNTER — Emergency Department (HOSPITAL_COMMUNITY): Payer: Medicare HMO

## 2018-05-14 DIAGNOSIS — Z79899 Other long term (current) drug therapy: Secondary | ICD-10-CM | POA: Insufficient documentation

## 2018-05-14 DIAGNOSIS — R109 Unspecified abdominal pain: Secondary | ICD-10-CM

## 2018-05-14 DIAGNOSIS — N200 Calculus of kidney: Secondary | ICD-10-CM | POA: Insufficient documentation

## 2018-05-14 DIAGNOSIS — N132 Hydronephrosis with renal and ureteral calculous obstruction: Secondary | ICD-10-CM | POA: Diagnosis not present

## 2018-05-14 DIAGNOSIS — R1032 Left lower quadrant pain: Secondary | ICD-10-CM | POA: Diagnosis present

## 2018-05-14 DIAGNOSIS — Z7982 Long term (current) use of aspirin: Secondary | ICD-10-CM | POA: Diagnosis not present

## 2018-05-14 HISTORY — DX: Disorder of kidney and ureter, unspecified: N28.9

## 2018-05-14 LAB — URINALYSIS, ROUTINE W REFLEX MICROSCOPIC
Bilirubin Urine: NEGATIVE
Glucose, UA: NEGATIVE mg/dL
Ketones, ur: NEGATIVE mg/dL
Leukocytes,Ua: NEGATIVE
Nitrite: NEGATIVE
Protein, ur: NEGATIVE mg/dL
RBC / HPF: >50 RBC/hpf — ABNORMAL HIGH (ref 0–5)
Specific Gravity, Urine: 1.021 (ref 1.005–1.030)
pH: 5 (ref 5.0–8.0)

## 2018-05-14 MED ORDER — OXYCODONE-ACETAMINOPHEN 5-325 MG PO TABS: 1.0000 | ORAL_TABLET | Freq: Four times a day (QID) | ORAL | 0 refills | Status: DC | PRN

## 2018-05-14 MED ORDER — KETOROLAC TROMETHAMINE 15 MG/ML IJ SOLN
15.0000 mg | Freq: Once | INTRAMUSCULAR | Status: DC
  Filled 2018-05-14: qty 1

## 2018-05-14 MED ORDER — HYDROMORPHONE HCL 1 MG/ML IJ SOLN
1.0000 mg | Freq: Once | INTRAMUSCULAR | Status: DC
  Filled 2018-05-14: qty 1

## 2018-05-14 MED ORDER — SODIUM CHLORIDE 0.9 % IV BOLUS: 1000.0000 mL | Freq: Once | INTRAVENOUS | Status: DC

## 2018-05-14 MED ORDER — ONDANSETRON 4 MG PO TBDP: 4.0000 mg | ORAL_TABLET | Freq: Three times a day (TID) | ORAL | 0 refills | Status: DC | PRN

## 2018-05-14 MED ORDER — ONDANSETRON HCL 4 MG/2ML IJ SOLN
4.0000 mg | Freq: Once | INTRAMUSCULAR | Status: DC
  Filled 2018-05-14: qty 2

## 2018-05-14 NOTE — ED Notes (Signed)
Bed: WA15 Expected date:  Expected time:  Means of arrival:  Comments: ResB 

## 2018-05-14 NOTE — ED Notes (Signed)
Urine culture sent to lab with UA sample 

## 2018-05-14 NOTE — Discharge Instructions (Addendum)
Take Ibuprofen 600 mg every 8 hours for moderate pain.  For severe pain, take the prescribed pain medications.  Drink plenty of fluids. Avoid sweet tea and soda.

## 2018-05-14 NOTE — ED Provider Notes (Signed)
[facility] DEPT Provider Note   CSN: [id number] Arrival date & time: 05/14/18  1214    History   Chief Complaint Chief Complaint  Patient presents with  . Flank Pain    HPI Roy Sullivan is a 68 y.o. male.     HPI 68 year old male with past medical history as below including multiple previous kidney stones here with left flank pain.  The patient was in his usual state of health until this morning.  He reports acute onset of severe, aching, throbbing, left flank pain.  Symptoms feel similar to his previous kidney stones.  He said associated mild dysuria, but no overt hematuria.  No fevers or chills.  He said mild nausea and did throw up once.  All of his previous kidney stones have required instrumentation, with Dr. Gaynelle Arabian in the past with [facility].  No other medical complaints.  Past Medical History:  Diagnosis Date  . History of kidney stones   . Renal disorder     There are no active problems to display for this patient.   Past Surgical History:  Procedure Laterality Date  . [place] and1994  . CYSTOSCOPY/RETROGRADE/URETEROSCOPY/STONE EXTRACTION WITH BASKET Left 05/01/2012   Procedure: CYSTOSCOPY/LEFT RETROGRADE PYELOGRAM/LEFT URETEROSCOPY/STONE EXTRACTION WITH BASKET;  Surgeon: Ailene Rud, MD;  Location: [facility];  Service: Urology;  Laterality: Left;      STONE BASKET EXTRACTION POSSIBLE JJ STENT PLACEMENT  C-ARM   . HOLMIUM LASER APPLICATION Left 9/83/3825   Procedure: HOLMIUM LASER APPLICATION;  Surgeon: Ailene Rud, MD;  Location: [facility];  Service: Urology;  Laterality: Left;  FRAGMENTATION   . STONE EXTRACTION WITH BASKET  2001        Home Medications    Prior to Admission medications   Medication Sig Start Date End Date Taking? Authorizing Provider  aspirin 325 MG EC tablet Take 325 mg by mouth every morning.     [provider]  ciprofloxacin (CIPRO) 500 MG tablet  Take 1 tablet (500 mg total) by mouth 2 (two) times daily. 05/01/12   Carolan Clines, MD  fish oil-omega-3 fatty acids 1000 MG capsule Take 1 g by mouth daily.    [provider]  ibuprofen (ADVIL,MOTRIN) 200 MG tablet Take 400 mg by mouth every 6 (six) hours as needed. Pain    [provider]  Multiple Vitamin (MULITIVITAMIN WITH MINERALS) TABS Take 1 tablet by mouth daily.    [provider]  ondansetron (ZOFRAN ODT) 4 MG disintegrating tablet Take 1 tablet (4 mg total) by mouth every 8 (eight) hours as needed for nausea or vomiting. 05/14/18   [name], MD  oxyCODONE-acetaminophen (PERCOCET/ROXICET) 5-325 MG tablet Take 1-2 tablets by mouth every 6 (six) hours as needed for moderate pain or severe pain. 05/14/18   [name], MD  silodosin (RAPAFLO) 8 MG CAPS capsule Take 1 capsule (8 mg total) by mouth daily with breakfast. 04/12/12   Carolan Clines, MD  URELLE (URELLE/URISED) 81 MG TABS Take 1 tablet (81 mg total) by mouth 3 (three) times daily. 05/01/12   Carolan Clines, MD    Family History Family History  Family history unknown: Yes    Social History Social History   Tobacco Use  . Smoking status: Never Smoker  . Smokeless tobacco: Never Used  Substance Use Topics  . Alcohol use: Yes    Comment: ocassional  . Drug use: No     Allergies   Patient  has no known allergies.   Review of Systems Review of Systems  Constitutional: Negative for chills, fatigue and fever.  HENT: Negative for congestion and rhinorrhea.   Eyes: Negative for visual disturbance.  Respiratory: Negative for cough, shortness of breath and wheezing.   Cardiovascular: Negative for chest pain and leg swelling.  Gastrointestinal: Positive for nausea and vomiting. Negative for abdominal pain and diarrhea.  Genitourinary: Positive for dysuria and flank pain.  Musculoskeletal: Negative for neck pain and neck stiffness.  Skin: Negative for rash and wound.   Allergic/Immunologic: Negative for immunocompromised state.  Neurological: Negative for syncope, weakness and headaches.  All other systems reviewed and are negative.    Physical Exam Updated Vital Signs BP (!) 159/82 (BP Location: Right Arm)   Pulse (!) 55   Temp 97.7 F (36.5 C) (Oral)   Resp 16   Ht 5\' 9"  (1.753 m)   Wt 83.9 kg   SpO2 100%   BMI 27.32 kg/m   Physical Exam Vitals signs and nursing note reviewed.  Constitutional:      General: He is not in acute distress.    Appearance: He is well-developed.  HENT:     Head: Normocephalic and atraumatic.  Eyes:     Conjunctiva/sclera: Conjunctivae normal.  Neck:     Musculoskeletal: Neck supple.  Cardiovascular:     Rate and Rhythm: Normal rate and regular rhythm.     Heart sounds: Normal heart sounds. No murmur. No friction rub.  Pulmonary:     Effort: Pulmonary effort is normal. No respiratory distress.     Breath sounds: Normal breath sounds. No wheezing or rales.  Abdominal:     General: There is no distension.     Palpations: Abdomen is soft.     Tenderness: There is no abdominal tenderness.     Comments: Moderate tenderness over the left posterior lateral flank.  No anterior abdominal tenderness.  Skin:    General: Skin is warm.     Capillary Refill: Capillary refill takes less than 2 seconds.  Neurological:     Mental Status: He is alert and oriented to person, place, and time.     Motor: No abnormal muscle tone.      ED Treatments / Results  Labs (all labs ordered are listed, but only abnormal results are displayed) Labs Reviewed  URINALYSIS, ROUTINE W REFLEX MICROSCOPIC - Abnormal; Notable for the following components:      Result Value   APPearance HAZY (*)    Hgb urine dipstick LARGE (*)    RBC / HPF >50 (*)    Bacteria, UA RARE (*)    All other components within normal limits  CBC WITH DIFFERENTIAL/PLATELET  BASIC METABOLIC PANEL    EKG None  Radiology Ct Renal Stone Study  Result  Date: 05/14/2018 CLINICAL DATA:  Left flank pain beginning today EXAM: CT ABDOMEN AND PELVIS WITHOUT CONTRAST TECHNIQUE: Multidetector CT imaging of the abdomen and pelvis was performed following the standard protocol without IV contrast. COMPARISON:  04/11/2012 FINDINGS: Lower chest:  No contributory findings. Hepatobiliary: No focal liver abnormality.No evidence of biliary obstruction or stone. Pancreas: Unremarkable. Spleen: Unremarkable. Adrenals/Urinary Tract: Negative adrenals. Mild left hydronephrosis and hydroureter to the level of the bladder without visible obstructing calculus. There is also asymmetric left perinephric stranding. A 4 mm stone is present in the dependent bladder, presumably recently passed. There are 4 left and punctate right renal calculi, up to 6 mm in the upper pole left kidney. No right  hydronephrosis. Unremarkable bladder. Stomach/Bowel:  No obstruction. No evidence of inflammation. Vascular/Lymphatic: No acute vascular abnormality. No mass or adenopathy. Reproductive:Negative. Other: No ascites or pneumoperitoneum. Musculoskeletal: No acute abnormalities. Lower lumbar degenerative disease with L3-4 spinal stenosis. IMPRESSION: 1. 4 mm stone in the bladder, presumably recently passed from the left given mild left hydroureteronephrosis. 2. Multiple left renal calculi. Electronically Signed   By: Monte Fantasia M.D.   On: 05/14/2018 13:23    Procedures Procedures (including critical care time)  Medications Ordered in ED Medications  HYDROmorphone (DILAUDID) injection 1 mg (has no administration in time range)  ondansetron (ZOFRAN) injection 4 mg (has no administration in time range)  sodium chloride 0.9 % bolus 1,000 mL (has no administration in time range)  ketorolac (TORADOL) 15 MG/ML injection 15 mg (has no administration in time range)     Initial Impression / Assessment and Plan / ED Course  I have reviewed the triage vital signs and the nursing notes.  Pertinent  labs & imaging results that were available during my care of the patient were reviewed by me and considered in my medical decision making (see chart for details).  Clinical Course as of May 13 1352  Sun May 13, 5064  4665 68 year old male here with recurrent left flank pain.  Suspect renal stone.  No fever or signs of infection.  Will send for stone study given his history of complicated stones and start analgesics and fluids.  Will give a dose of Toradol as he has no history of renal insufficiency.   [CI]  1320 Urinalysis shows significant hematuria consistent with stone.  No pyuria, no fevers, doubt infected stone.  Of note, while obtaining CT, patient symptoms have completely resolved.  Will follow-up CT and reassess.  He would like to hold on labs and IV at this time which I think is reasonable given absence of pain and possible passed stone, though with his history, this is somewhat unlikely.   [CI]  1350 CT scan actually shows 4 mm stone in the bladder.  Patient remains asymptomatic now.  Given that I suspect he had recently passed stone with no evidence of infection or complication, I think is reasonable to hold on his fluid and labs at this time.  Will DC him with brief course of analgesics as needed for possible spasm related to recently passed stone, and have him follow-up with urology.  Encourage fluids.  Avoid sodas and tea.   [CI]    Clinical Course User Index [CI] [name], MD      Final Clinical Impressions(s) / ED Diagnoses   Final diagnoses:  Left flank pain  Kidney stone    ED Discharge Orders         Ordered    oxyCODONE-acetaminophen (PERCOCET/ROXICET) 5-325 MG tablet  Every 6 hours PRN     05/14/18 1352    ondansetron (ZOFRAN ODT) 4 MG disintegrating tablet  Every 8 hours PRN     05/14/18 1352           [name], MD 05/14/18 1354

## 2018-05-14 NOTE — ED Triage Notes (Signed)
Patient c/o left flank since 0900 today. Patient has a history of kidney stones. Patient denies dysuria, visible hematuria, or urinary retention.

## 2018-05-14 NOTE — ED Notes (Signed)
He states that now his pain is 0/10 and he feels "fine". I let Dr. Ellender Hose know.

## 2018-06-09 DIAGNOSIS — N202 Calculus of kidney with calculus of ureter: Secondary | ICD-10-CM | POA: Diagnosis not present

## 2018-06-09 DIAGNOSIS — R310 Gross hematuria: Secondary | ICD-10-CM | POA: Diagnosis not present

## 2018-06-09 DIAGNOSIS — N200 Calculus of kidney: Secondary | ICD-10-CM | POA: Diagnosis not present

## 2018-06-09 DIAGNOSIS — R1084 Generalized abdominal pain: Secondary | ICD-10-CM | POA: Diagnosis not present

## 2018-06-20 DIAGNOSIS — Z87442 Personal history of urinary calculi: Secondary | ICD-10-CM | POA: Diagnosis not present

## 2018-06-27 DIAGNOSIS — R69 Illness, unspecified: Secondary | ICD-10-CM | POA: Diagnosis not present

## 2018-07-25 DIAGNOSIS — R3912 Poor urinary stream: Secondary | ICD-10-CM | POA: Diagnosis not present

## 2018-07-25 DIAGNOSIS — N200 Calculus of kidney: Secondary | ICD-10-CM | POA: Diagnosis not present

## 2018-07-25 DIAGNOSIS — N401 Enlarged prostate with lower urinary tract symptoms: Secondary | ICD-10-CM | POA: Diagnosis not present

## 2018-08-10 ENCOUNTER — Other Ambulatory Visit: Payer: Self-pay | Admitting: Urology

## 2018-08-11 ENCOUNTER — Other Ambulatory Visit: Payer: Self-pay

## 2018-08-11 ENCOUNTER — Encounter (HOSPITAL_COMMUNITY): Payer: Self-pay | Admitting: *Deleted

## 2018-08-14 ENCOUNTER — Ambulatory Visit (HOSPITAL_COMMUNITY): Payer: Medicare HMO

## 2018-08-14 ENCOUNTER — Other Ambulatory Visit: Payer: Self-pay

## 2018-08-14 ENCOUNTER — Encounter (HOSPITAL_COMMUNITY)
Admission: RE | Disposition: A | Discharge status: Home / Self Care | Payer: Self-pay | Source: Home / Self Care | Attending: Urology

## 2018-08-14 ENCOUNTER — Ambulatory Visit (HOSPITAL_COMMUNITY)
Admission: RE | Admit: 2018-08-14 | Discharge: 2018-08-14 | Disposition: A | Discharge status: Home / Self Care | Payer: Medicare HMO | Attending: Urology | Admitting: Urology

## 2018-08-14 ENCOUNTER — Encounter (HOSPITAL_COMMUNITY): Payer: Self-pay | Admitting: *Deleted

## 2018-08-14 DIAGNOSIS — Z87442 Personal history of urinary calculi: Secondary | ICD-10-CM | POA: Diagnosis not present

## 2018-08-14 DIAGNOSIS — N200 Calculus of kidney: Secondary | ICD-10-CM

## 2018-08-14 DIAGNOSIS — N201 Calculus of ureter: Secondary | ICD-10-CM | POA: Diagnosis not present

## 2018-08-14 DIAGNOSIS — Z01818 Encounter for other preprocedural examination: Secondary | ICD-10-CM | POA: Diagnosis not present

## 2018-08-14 HISTORY — PX: EXTRACORPOREAL SHOCK WAVE LITHOTRIPSY: SHX1557

## 2018-08-14 HISTORY — DX: Cardiac arrhythmia, unspecified: I49.9

## 2018-08-14 SURGERY — LITHOTRIPSY, ESWL
Anesthesia: LOCAL | Laterality: Left

## 2018-08-14 MED ORDER — SODIUM CHLORIDE 0.9 % IV SOLN
INTRAVENOUS | Status: DC
  Administered 2018-08-14: 10:00:00 via INTRAVENOUS

## 2018-08-14 MED ORDER — CIPROFLOXACIN HCL 500 MG PO TABS
500.0000 mg | ORAL_TABLET | ORAL | Status: AC
  Administered 2018-08-14: 500 mg via ORAL
  Filled 2018-08-14: qty 1

## 2018-08-14 MED ORDER — DIPHENHYDRAMINE HCL 25 MG PO CAPS
25.0000 mg | ORAL_CAPSULE | ORAL | Status: AC
  Administered 2018-08-14: 11:00:00 25 mg via ORAL
  Filled 2018-08-14: qty 1

## 2018-08-14 MED ORDER — DIAZEPAM 5 MG PO TABS
10.0000 mg | ORAL_TABLET | ORAL | Status: AC
  Administered 2018-08-14: 11:00:00 10 mg via ORAL
  Filled 2018-08-14: qty 2

## 2018-08-14 NOTE — H&P (Signed)
Roy Sullivan is an 68 y.o. male.    Chief Complaint: Pre-Op LEFT Shockwave Lithotripsy  HPI:   1 - LEFT Distal Ureteral Stone - 52mm left UVJ stone by CT and KUB x several since May 2020. 10cm SSD (posterior), 300HU, at level of mid femoral head.   Past Medical History:  Diagnosis Date  . Dysrhythmia    pt unaware of diagnosis  . History of kidney stones   . Renal disorder     Past Surgical History:  Procedure Laterality Date  . [place] and1994  . CYSTOSCOPY/RETROGRADE/URETEROSCOPY/STONE EXTRACTION WITH BASKET Left 05/01/2012   Procedure: CYSTOSCOPY/LEFT RETROGRADE PYELOGRAM/LEFT URETEROSCOPY/STONE EXTRACTION WITH BASKET;  Surgeon: Ailene Rud, MD;  Location: [facility];  Service: Urology;  Laterality: Left;      STONE BASKET EXTRACTION POSSIBLE JJ STENT PLACEMENT  C-ARM   . HOLMIUM LASER APPLICATION Left 6/96/2952   Procedure: HOLMIUM LASER APPLICATION;  Surgeon: Ailene Rud, MD;  Location: [facility];  Service: Urology;  Laterality: Left;  FRAGMENTATION   . LUMBAR LAMINECTOMY  1994, 1989   L4-5, S1  . STONE EXTRACTION WITH BASKET  2001    Family History  Family history unknown: Yes   Social History:  reports that he has never smoked. He has never used smokeless tobacco. He reports current alcohol use. He reports that he does not use drugs.  Allergies: No Known Allergies  No medications prior to admission.    No results found for this or any previous visit (from the past 48 hour(s)). No results found.  Review of Systems  Constitutional: Negative for chills and fever.  Genitourinary: Positive for flank pain.  All other systems reviewed and are negative.   Height 5' 8.5" (1.74 m), weight 81.6 kg. Physical Exam  Constitutional: He appears well-developed.  HENT:  Head: Normocephalic.  Eyes: Pupils are equal, round, and reactive to light.  Neck: Normal range of motion.  Cardiovascular: Normal rate.  Respiratory: Effort normal.   GI: Soft.  Genitourinary:    Genitourinary Comments: Mild left CVAT at present.    Musculoskeletal: Normal range of motion.  Neurological: He is alert.  Skin: Skin is warm.  Psychiatric: He has a normal mood and affect.     Assessment/Plan Proceed as planned with LEFT shockwave lithotripsy. Risks, benefits, alternatives, expected peri-treatment course discussed  [name], MD 08/14/2018, 5:33 AM

## 2018-08-14 NOTE — Discharge Instructions (Signed)
1 - Call MD or go to ER for fever >102, severe pain / nausea / vomiting not relieved by medications, or acute change in medical status  

## 2018-08-14 NOTE — Progress Notes (Signed)
Pre-SWL KUB reveiwed and no definitive GU calcifications. Pt taken to SWL trailer and on table fluoroscopy failed to demonstrate any targetable GU stones. 45mL isovue given and prompt uptake and excretion to level of bladder on left without hydronephrosis. No SWL perfromed as not obstructing stones noted.

## 2018-08-15 ENCOUNTER — Encounter (HOSPITAL_COMMUNITY): Payer: Self-pay | Admitting: Urology

## 2018-08-19 IMAGING — CT CT CHEST W/O CM
3 of 4 series · 16 of 30 positions shown, 18 images · non-contrast
Comparison: Chest CT examination April 17, 2012; CT abdomen and
pelvis including the lower chest April 11, 2012

CLINICAL DATA: Calcified anterior mediastinal lesions.  Cough.

EXAM:
CT CHEST WITHOUT CONTRAST
TECHNIQUE: Multidetector CT imaging of the chest was performed following the
standard protocol without IV contrast.

[Series 3: chest w/o · axial · non-contrast · 0.77mm/px · z∈[-284,-24]mm · 7 of 140 slices shown, 9 images]
[im 18/140  mediastinal]
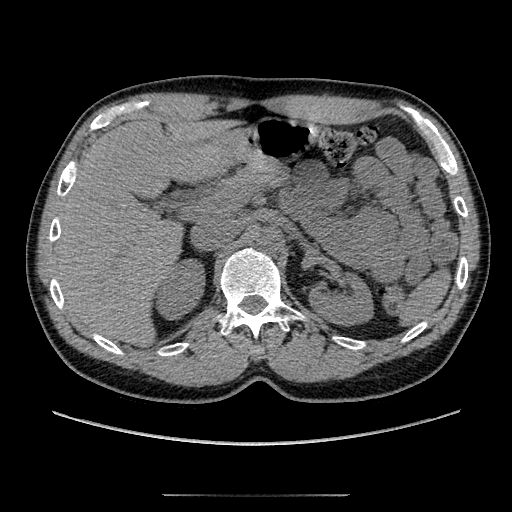
[im 18/140  lung]
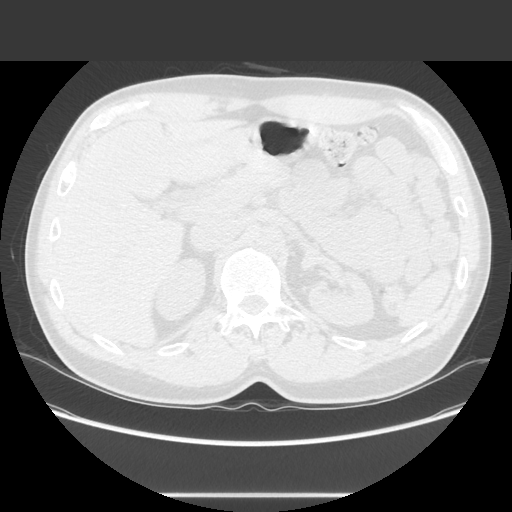
[im 35/140  lung]
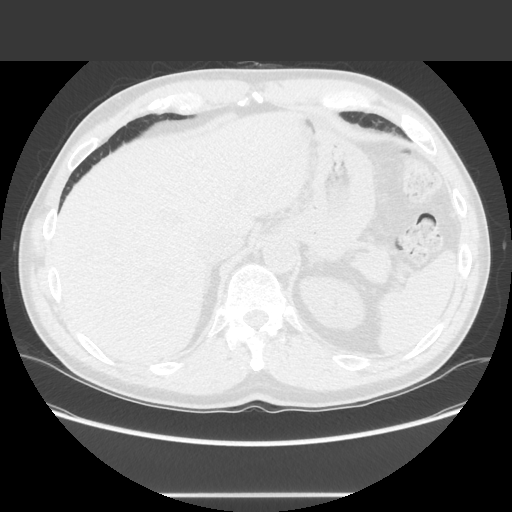
[im 53/140  lung]
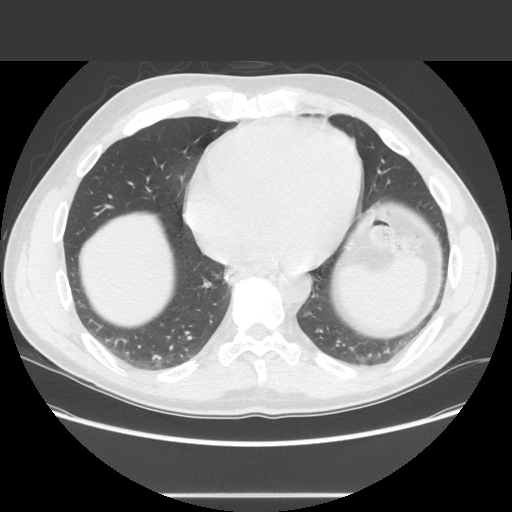
[im 70/140  lung]
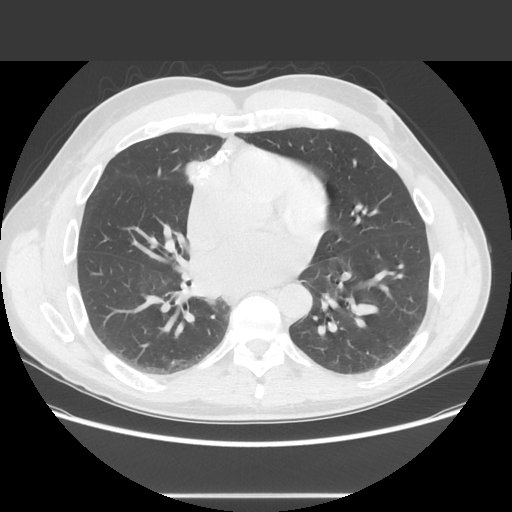
[im 87/140  mediastinal]
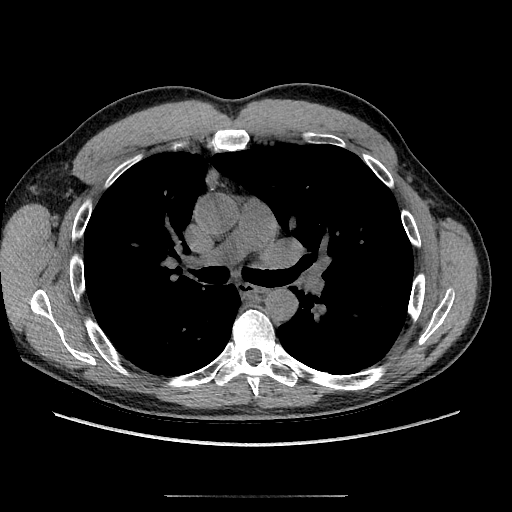
[im 87/140  lung]
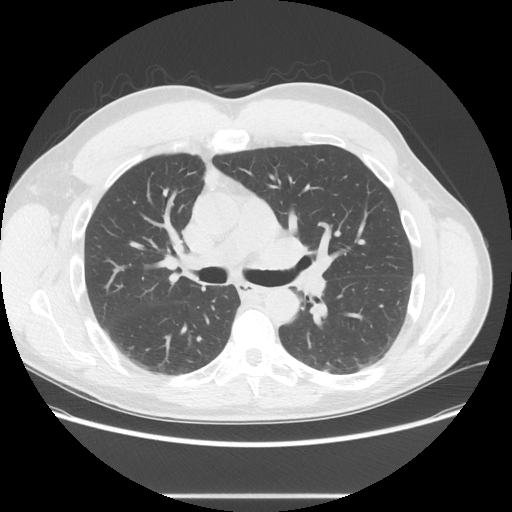
[im 105/140  lung]
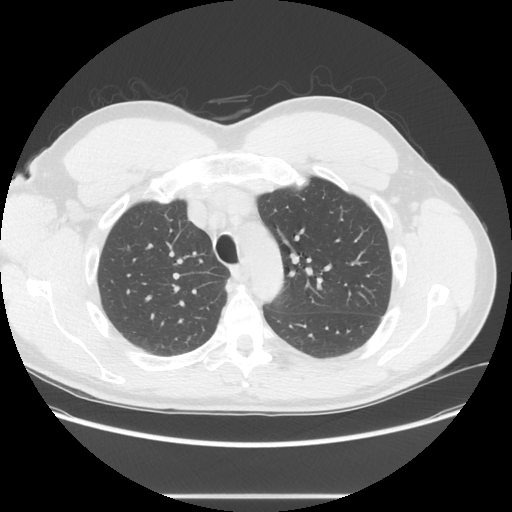
[im 122/140  lung]
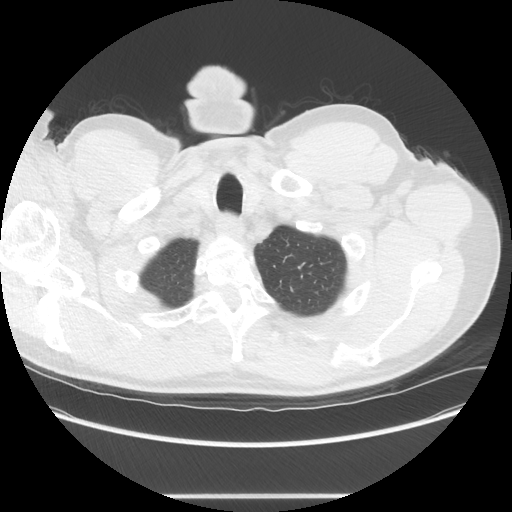

[Series 4: lung windows · axial · 0.77mm/px · z∈[-284,-24]mm · 7 of 140 slices shown]
[im 18/140  lung]
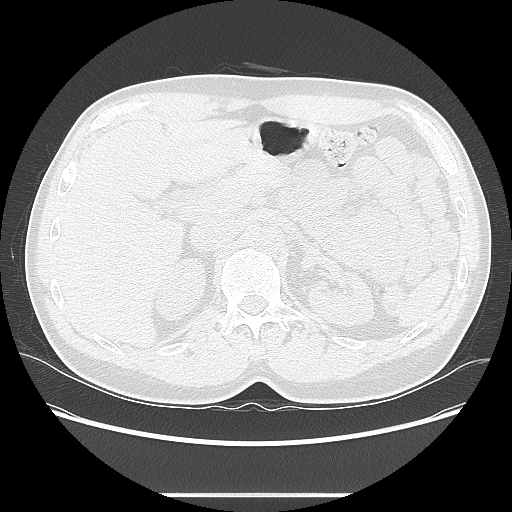
[im 35/140  lung]
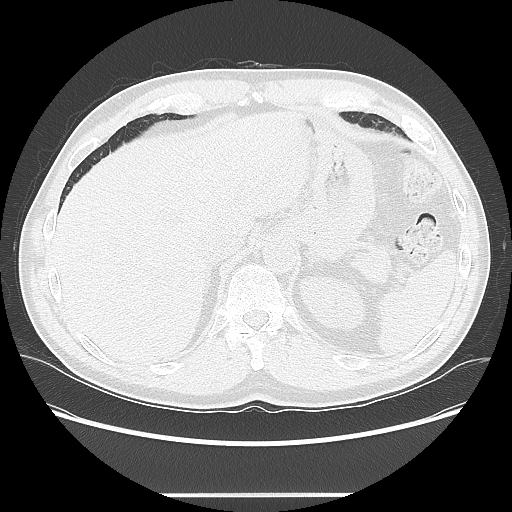
[im 53/140  lung]
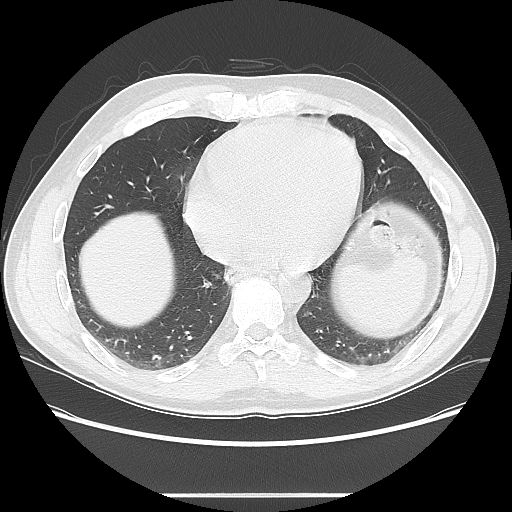
[im 70/140  lung]
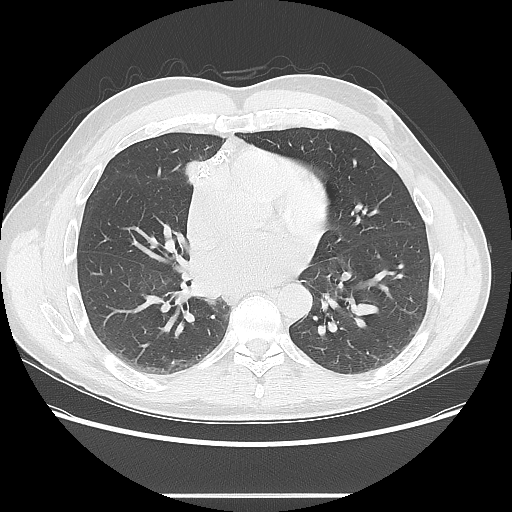
[im 87/140  lung]
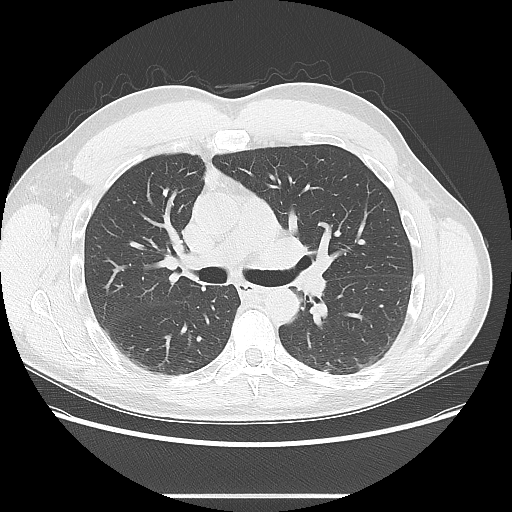
[im 105/140  lung]
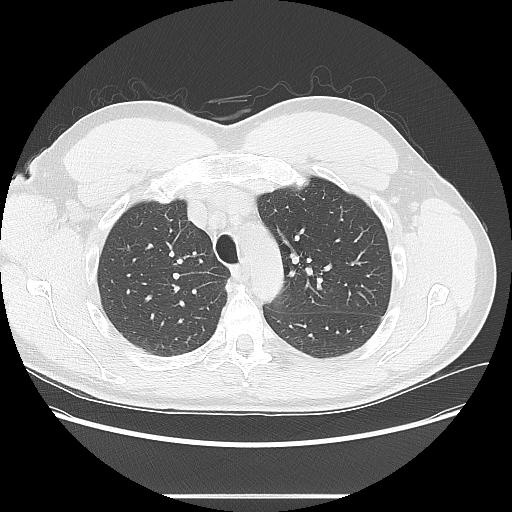
[im 122/140  lung]
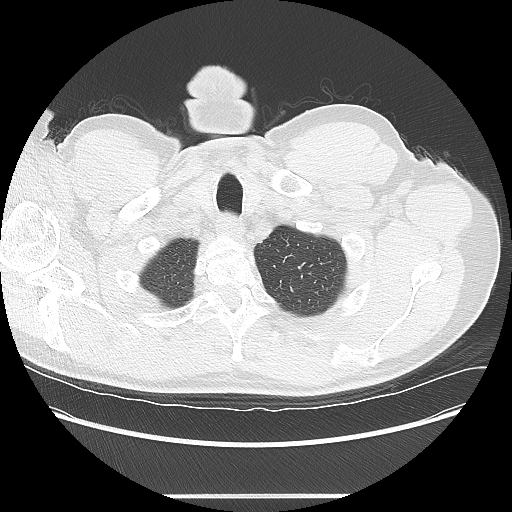

[Series 602: sagittal body · sagittal · 0.77mm/px · 2 of 158 slices shown]
[im 18/158  mediastinal]
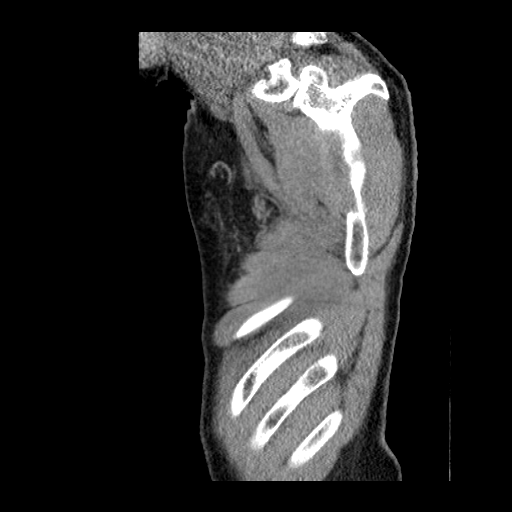
[im 35/158  mediastinal]
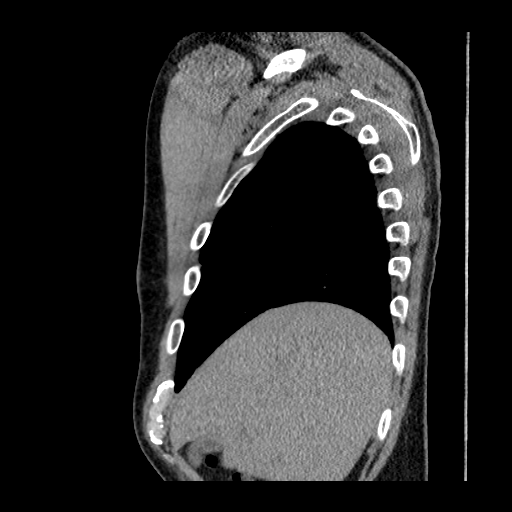

[16 of 30 positions shown; findings below may reference images not displayed]

FINDINGS: Cardiovascular: There is no thoracic aortic aneurysm. The visualized
great vessels appear normal on this noncontrast enhanced study.
There is a slight amount of coronary artery calcification.
Pericardium is not appreciably thickened.

Mediastinum/Nodes: Thyroid appears unremarkable. There are again
noted inferior anterior mediastinal masses showing calcification,
unchanged from the 3048 and studies. Specifically, the largest of
these lesions measures 2.8 by 2.4 cm, stable. There is no new lesion
in the anterior mediastinum. The distribution of calcification in
these lesions remain stable. Elsewhere, there is no lymph node
enlargement or new calcifications. No esophageal lesions are
evident.

Lungs/Pleura: There is no edema or consolidation. No pleural
effusion or pleural thickening evident. There is slight dependent
atelectasis in the posterior lung bases. There is a stable 2 mm
nodular opacity in the superior segment of the right lower lobe seen
on axial slice 66 series 4.

Upper Abdomen: There is a 1 mm calculus in the upper pole left
kidney. Slightly more inferiorly and laterally in the upper pole
left kidney, there is a nonobstructing 5 x 5 mm calculus. Visualized
upper abdominal structures otherwise appear unremarkable.

Musculoskeletal: There is degenerative change in the thoracic spine
with increase in kyphosis. No blastic or lytic bone lesions are
evident.
IMPRESSION: 1. Stable calcified inferior anterior mediastinum masses, largest
measuring 2.8 x 2.4 cm. The distribution of calcification in this
area is stable. The appearance is most consistent with the calcified
lymph nodes due to prior granulomatous disease. No new lymph node
prominence is noted in the chest. No new anterior mediastinal
lesion. No new calcifications elsewhere in the chest. Stability
since 3048 is indicative of benign etiology.

2. No lung edema or consolidation. No pleural effusion. Stable 2 mm
nodular opacity superior segment right lower lobe.

3.  Slight degree of coronary artery calcification.

4.  Nonobstructing calculi upper pole left kidney.

## 2018-09-01 ENCOUNTER — Encounter (HOSPITAL_COMMUNITY): Payer: Self-pay | Admitting: Urology

## 2018-09-28 DIAGNOSIS — Z23 Encounter for immunization: Secondary | ICD-10-CM | POA: Diagnosis not present

## 2018-10-31 DIAGNOSIS — I951 Orthostatic hypotension: Secondary | ICD-10-CM | POA: Diagnosis not present

## 2018-10-31 DIAGNOSIS — Z820 Family history of epilepsy and other diseases of the nervous system: Secondary | ICD-10-CM | POA: Diagnosis not present

## 2018-10-31 DIAGNOSIS — Z803 Family history of malignant neoplasm of breast: Secondary | ICD-10-CM | POA: Diagnosis not present

## 2018-10-31 DIAGNOSIS — Z7982 Long term (current) use of aspirin: Secondary | ICD-10-CM | POA: Diagnosis not present

## 2018-12-12 DIAGNOSIS — Z0000 Encounter for general adult medical examination without abnormal findings: Secondary | ICD-10-CM | POA: Diagnosis not present

## 2018-12-12 DIAGNOSIS — E7800 Pure hypercholesterolemia, unspecified: Secondary | ICD-10-CM | POA: Diagnosis not present

## 2018-12-12 DIAGNOSIS — Z125 Encounter for screening for malignant neoplasm of prostate: Secondary | ICD-10-CM | POA: Diagnosis not present

## 2018-12-12 DIAGNOSIS — J8410 Pulmonary fibrosis, unspecified: Secondary | ICD-10-CM | POA: Diagnosis not present

## 2018-12-12 DIAGNOSIS — Z1389 Encounter for screening for other disorder: Secondary | ICD-10-CM | POA: Diagnosis not present

## 2018-12-12 DIAGNOSIS — R69 Illness, unspecified: Secondary | ICD-10-CM | POA: Diagnosis not present

## 2018-12-12 DIAGNOSIS — I2510 Atherosclerotic heart disease of native coronary artery without angina pectoris: Secondary | ICD-10-CM | POA: Diagnosis not present

## 2018-12-12 DIAGNOSIS — L609 Nail disorder, unspecified: Secondary | ICD-10-CM | POA: Diagnosis not present

## 2019-01-31 ENCOUNTER — Ambulatory Visit: Payer: Medicare Other | Attending: Internal Medicine

## 2019-01-31 DIAGNOSIS — Z23 Encounter for immunization: Secondary | ICD-10-CM | POA: Insufficient documentation

## 2019-01-31 NOTE — Progress Notes (Signed)
   Covid-19 Vaccination Clinic  Name:  Roy Sullivan    MRN: [id number] DOB: [birth year]  01/31/2019  Roy Sullivan was observed post Covid-19 immunization for 15 minutes without incidence. He was provided with Vaccine Information Sheet and instruction to access the V-Safe system.   Roy Sullivan was instructed to call 911 with any severe reactions post vaccine: Marland Kitchen Difficulty breathing  . Swelling of your face and throat  . A fast heartbeat  . A bad rash all over your body  . Dizziness and weakness    Immunizations Administered    Name Date Dose VIS Date Route   Pfizer COVID-19 Vaccine 01/31/2019  6:36 PM 0.3 mL 12/22/2018 Intramuscular   Manufacturer: [place]   Lot: [id number]   [place]: [id number]

## 2019-02-18 ENCOUNTER — Encounter (HOSPITAL_COMMUNITY): Payer: Self-pay

## 2019-02-18 ENCOUNTER — Other Ambulatory Visit: Payer: Self-pay

## 2019-02-18 ENCOUNTER — Emergency Department (HOSPITAL_COMMUNITY)
Admission: EM | Admit: 2019-02-18 | Discharge: 2019-02-18 | Disposition: A | Discharge status: Home / Self Care | Payer: Medicare HMO | Attending: Emergency Medicine | Admitting: Emergency Medicine

## 2019-02-18 DIAGNOSIS — N23 Unspecified renal colic: Secondary | ICD-10-CM | POA: Diagnosis not present

## 2019-02-18 DIAGNOSIS — Z79899 Other long term (current) drug therapy: Secondary | ICD-10-CM | POA: Insufficient documentation

## 2019-02-18 DIAGNOSIS — Z7982 Long term (current) use of aspirin: Secondary | ICD-10-CM | POA: Insufficient documentation

## 2019-02-18 DIAGNOSIS — R109 Unspecified abdominal pain: Secondary | ICD-10-CM | POA: Diagnosis present

## 2019-02-18 LAB — CBC WITH DIFFERENTIAL/PLATELET
Abs Immature Granulocytes: 0.03 10*3/uL (ref 0.00–0.07)
Basophils Absolute: 0 10*3/uL (ref 0.0–0.1)
Basophils Relative: 0 %
Eosinophils Absolute: 0 10*3/uL (ref 0.0–0.5)
Eosinophils Relative: 1 %
HCT: 43.7 % (ref 39.0–52.0)
Hemoglobin: 14.4 g/dL (ref 13.0–17.0)
Immature Granulocytes: 0 %
Lymphocytes Relative: 9 %
Lymphs Abs: 0.8 10*3/uL (ref 0.7–4.0)
MCH: 30.2 pg (ref 26.0–34.0)
MCHC: 33 g/dL (ref 30.0–36.0)
MCV: 91.6 fL (ref 80.0–100.0)
Monocytes Absolute: 0.5 10*3/uL (ref 0.1–1.0)
Monocytes Relative: 6 %
Neutro Abs: 6.9 10*3/uL (ref 1.7–7.7)
Neutrophils Relative %: 84 %
Platelets: 211 10*3/uL (ref 150–400)
RBC: 4.77 MIL/uL (ref 4.22–5.81)
RDW: 12.1 % (ref 11.5–15.5)
WBC: 8.2 10*3/uL (ref 4.0–10.5)
nRBC: 0 % (ref 0.0–0.2)

## 2019-02-18 LAB — BASIC METABOLIC PANEL
Anion gap: 6 (ref 5–15)
BUN: 33 mg/dL — ABNORMAL HIGH (ref 8–23)
CO2: 29 mmol/L (ref 22–32)
Calcium: 9.1 mg/dL (ref 8.9–10.3)
Chloride: 107 mmol/L (ref 98–111)
Creatinine, Ser: 1.28 mg/dL — ABNORMAL HIGH (ref 0.61–1.24)
GFR calc Af Amer: >60 mL/min (ref >60)
GFR calc non Af Amer: 57 mL/min — ABNORMAL LOW (ref >60)
Glucose, Bld: 118 mg/dL — ABNORMAL HIGH (ref 70–99)
Potassium: 4.7 mmol/L (ref 3.5–5.1)
Sodium: 142 mmol/L (ref 135–145)

## 2019-02-18 LAB — URINALYSIS, ROUTINE W REFLEX MICROSCOPIC
Bilirubin Urine: NEGATIVE
Glucose, UA: NEGATIVE mg/dL
Hgb urine dipstick: NEGATIVE
Ketones, ur: NEGATIVE mg/dL
Leukocytes,Ua: NEGATIVE
Nitrite: NEGATIVE
Protein, ur: NEGATIVE mg/dL
Specific Gravity, Urine: 1.024 (ref 1.005–1.030)
pH: 5 (ref 5.0–8.0)

## 2019-02-18 MED ORDER — HYDROMORPHONE HCL 1 MG/ML IJ SOLN
1.0000 mg | Freq: Once | INTRAMUSCULAR | Status: AC
  Administered 2019-02-18: 1 mg via INTRAVENOUS
  Filled 2019-02-18: qty 1

## 2019-02-18 MED ORDER — PROMETHAZINE HCL 25 MG/ML IJ SOLN
12.5000 mg | Freq: Once | INTRAMUSCULAR | Status: AC
  Administered 2019-02-18: 12.5 mg via INTRAVENOUS
  Filled 2019-02-18: qty 1

## 2019-02-18 MED ORDER — OXYCODONE-ACETAMINOPHEN 5-325 MG PO TABS: 1.0000 | ORAL_TABLET | Freq: Four times a day (QID) | ORAL | 0 refills | Status: DC | PRN

## 2019-02-18 MED ORDER — PROMETHAZINE HCL 25 MG PO TABS: 25.0000 mg | ORAL_TABLET | Freq: Four times a day (QID) | ORAL | 0 refills | Status: DC | PRN

## 2019-02-18 MED ORDER — KETOROLAC TROMETHAMINE 15 MG/ML IJ SOLN
15.0000 mg | Freq: Once | INTRAMUSCULAR | Status: AC
  Administered 2019-02-18: 15 mg via INTRAVENOUS
  Filled 2019-02-18: qty 1

## 2019-02-18 MED ORDER — LACTATED RINGERS IV SOLN: INTRAVENOUS | Status: DC

## 2019-02-18 NOTE — ED Triage Notes (Signed)
Pt presents with c/o left side flank pain that started this afternoon. Pt reports hx of kidney stones. Pt denies any hematuria at this time.

## 2019-02-18 NOTE — ED Notes (Signed)
Pt verbalizes understanding of DC instructions. Pt belongings returned and is ambulatory out of ED.  

## 2019-02-18 NOTE — Discharge Instructions (Addendum)
You can take ibuprofen 400 mg every 6 hours as needed. Percocet for break through pain. Phenergan as needed for nausea.

## 2019-02-18 NOTE — ED Provider Notes (Signed)
[facility] DEPT Provider Note   CSN: [id number] Arrival date & time: 02/18/19  1651     History Chief Complaint  Patient presents with  . Flank Pain    Roy Sullivan is a 69 y.o. male.  HPI   69 year old male with left flank pain.  History of recurrent kidney stones.  States that current symptoms feel similar.  Sharp pain in the left flank to left mid to lower back.  No specific urinary complaints.  Is nauseated and has vomited.  He had Percocet and Zofran previously prescribed after a prior kidney stone.  He tried taking these without much improvement.  No fevers or chills.  Has been seen by [facility] previously.  Past Medical History:  Diagnosis Date  . Dysrhythmia    pt unaware of diagnosis  . History of kidney stones   . Renal disorder     There are no problems to display for this patient.   Past Surgical History:  Procedure Laterality Date  . Coward and1994  . CYSTOSCOPY/RETROGRADE/URETEROSCOPY/STONE EXTRACTION WITH BASKET Left 05/01/2012   Procedure: CYSTOSCOPY/LEFT RETROGRADE PYELOGRAM/LEFT URETEROSCOPY/STONE EXTRACTION WITH BASKET;  Surgeon: Ailene Rud, MD;  Location: [facility];  Service: Urology;  Laterality: Left;      STONE BASKET EXTRACTION POSSIBLE JJ STENT PLACEMENT  C-ARM   . EXTRACORPOREAL SHOCK WAVE LITHOTRIPSY Left 08/14/2018   Procedure: EXTRACORPOREAL SHOCK WAVE LITHOTRIPSY (ESWL);  Surgeon: [name], MD;  Location: [facility];  Service: Urology;  Laterality: Left;  . HOLMIUM LASER APPLICATION Left [id number]   Procedure: HOLMIUM LASER APPLICATION;  Surgeon: Ailene Rud, MD;  Location: [facility];  Service: Urology;  Laterality: Left;  FRAGMENTATION   . LUMBAR LAMINECTOMY  1994, 1989   L4-5, S1  . STONE EXTRACTION WITH BASKET  2001       Family History  Family history unknown: Yes    Social History   Tobacco Use  . Smoking status: Never Smoker  . Smokeless tobacco:  Never Used  Substance Use Topics  . Alcohol use: Yes    Comment: ocassional  . Drug use: No    Home Medications Prior to Admission medications   Medication Sig Start Date End Date Taking? Authorizing Provider  aspirin 325 MG EC tablet Take 325 mg by mouth every morning.     [provider]  fish oil-omega-3 fatty acids 1000 MG capsule Take 1 g by mouth daily.    [provider]  ibuprofen (ADVIL,MOTRIN) 200 MG tablet Take 400 mg by mouth every 6 (six) hours as needed. Pain    [provider]  Multiple Vitamin (MULITIVITAMIN WITH MINERALS) TABS Take 1 tablet by mouth daily.    [provider]  ondansetron (ZOFRAN ODT) 4 MG disintegrating tablet Take 1 tablet (4 mg total) by mouth every 8 (eight) hours as needed for nausea or vomiting. 05/14/18   [name], MD  oxyCODONE-acetaminophen (PERCOCET/ROXICET) 5-325 MG tablet Take 1-2 tablets by mouth every 6 (six) hours as needed for moderate pain or severe pain. 05/14/18   [name], MD  tamsulosin (FLOMAX) 0.4 MG CAPS capsule Take 0.4 mg by mouth. 07/26/18   [provider]    Allergies    Patient has no known allergies.  Review of Systems   Review of Systems All systems reviewed and negative, other than as noted in HPI.  Physical Exam Updated Vital Signs BP (!) 163/91 (BP Location: Right Arm)   Pulse Marland Kitchen)  46   Temp 98 F (36.7 C) (Oral)   Resp 18   SpO2 100%   Physical Exam Vitals and nursing note reviewed.  Constitutional:      General: He is not in acute distress.    Appearance: He is well-developed.     Comments: Standing beside stretcher.  Appears uncomfortable, but nontoxic.  HENT:     Head: Normocephalic and atraumatic.  Eyes:     General:        Right eye: No discharge.        Left eye: No discharge.     Conjunctiva/sclera: Conjunctivae normal.  Cardiovascular:     Rate and Rhythm: Normal rate and regular rhythm.     Heart sounds: Normal heart sounds. No murmur. No  friction rub. No gallop.   Pulmonary:     Effort: Pulmonary effort is normal. No respiratory distress.     Breath sounds: Normal breath sounds.  Abdominal:     General: There is no distension.     Palpations: Abdomen is soft.     Tenderness: There is no abdominal tenderness.  Genitourinary:    Comments: No CVA tenderness Musculoskeletal:        General: No tenderness.     Cervical back: Neck supple.  Skin:    General: Skin is warm and dry.  Neurological:     Mental Status: He is alert.  Psychiatric:        Behavior: Behavior normal.        Thought Content: Thought content normal.     ED Results / Procedures / Treatments   Labs (all labs ordered are listed, but only abnormal results are displayed) Labs Reviewed  URINALYSIS, ROUTINE W REFLEX MICROSCOPIC - Abnormal; Notable for the following components:      Result Value   APPearance HAZY (*)    All other components within normal limits  BASIC METABOLIC PANEL - Abnormal; Notable for the following components:   Glucose, Bld 118 (*)    BUN 33 (*)    Creatinine, Ser 1.28 (*)    GFR calc non Af Amer 57 (*)    All other components within normal limits  CBC WITH DIFFERENTIAL/PLATELET    EKG None  Radiology No results found.  Procedures Procedures (including critical care time)  Medications Ordered in ED Medications  HYDROmorphone (DILAUDID) injection 1 mg (has no administration in time range)  lactated ringers infusion (has no administration in time range)  ketorolac (TORADOL) 15 MG/ML injection 15 mg (has no administration in time range)  promethazine (PHENERGAN) injection 12.5 mg (has no administration in time range)    ED Course  I have reviewed the triage vital signs and the nursing notes.  Pertinent labs & imaging results that were available during my care of the patient were reviewed by me and considered in my medical decision making (see chart for details).    MDM Rules/Calculators/A&P                       69 year old male with likely ureteral colic based on symptoms and history.  No blood on UA though.  His exam is benign.  No abdominal tenderness or CVA tenderness.  Imaging deferred. plan symptomatic treatment/expectant management.  Return precautions were discussed.  Outpatient follow-up with urology otherwise.    Final Clinical Impression(s) / ED Diagnoses Final diagnoses:  Ureteral colic    Rx / DC Orders ED Discharge Orders    None  Virgel Manifold, MD 02/20/19 1001

## 2019-02-21 ENCOUNTER — Ambulatory Visit: Payer: Medicare HMO | Attending: Internal Medicine

## 2019-02-21 DIAGNOSIS — Z23 Encounter for immunization: Secondary | ICD-10-CM | POA: Insufficient documentation

## 2019-02-21 NOTE — Progress Notes (Signed)
   Covid-19 Vaccination Clinic  Name:  Roy Sullivan    MRN: [id number] DOB: 04-04-1950  02/21/2019  [name] was observed post Covid-19 immunization for 15 minutes without incidence. He was provided with Vaccine Information Sheet and instruction to access the V-Safe system.   [name] was instructed to call 911 with any severe reactions post vaccine: Marland Kitchen Difficulty breathing  . Swelling of your face and throat  . A fast heartbeat  . A bad rash all over your body  . Dizziness and weakness    Immunizations Administered    Name Date Dose VIS Date Route   Pfizer COVID-19 Vaccine 02/21/2019 10:46 AM 0.3 mL 12/22/2018 Intramuscular   Manufacturer: [place]   Lot: [id number]   [place]: [id number]

## 2019-02-23 DIAGNOSIS — R1084 Generalized abdominal pain: Secondary | ICD-10-CM | POA: Diagnosis not present

## 2019-02-23 DIAGNOSIS — Z87442 Personal history of urinary calculi: Secondary | ICD-10-CM | POA: Diagnosis not present

## 2019-02-23 DIAGNOSIS — N200 Calculus of kidney: Secondary | ICD-10-CM | POA: Diagnosis not present

## 2019-03-09 DIAGNOSIS — R1084 Generalized abdominal pain: Secondary | ICD-10-CM | POA: Diagnosis not present

## 2019-03-27 DIAGNOSIS — R69 Illness, unspecified: Secondary | ICD-10-CM | POA: Diagnosis not present

## 2019-04-05 DIAGNOSIS — N200 Calculus of kidney: Secondary | ICD-10-CM | POA: Diagnosis not present

## 2019-04-20 DIAGNOSIS — Z20828 Contact with and (suspected) exposure to other viral communicable diseases: Secondary | ICD-10-CM | POA: Diagnosis not present

## 2019-05-08 DIAGNOSIS — N200 Calculus of kidney: Secondary | ICD-10-CM | POA: Diagnosis not present

## 2019-06-23 ENCOUNTER — Ambulatory Visit (HOSPITAL_COMMUNITY): Admit: 2019-06-23 | Payer: Self-pay

## 2019-07-23 DIAGNOSIS — M25562 Pain in left knee: Secondary | ICD-10-CM | POA: Diagnosis not present

## 2019-07-31 DIAGNOSIS — M25562 Pain in left knee: Secondary | ICD-10-CM | POA: Diagnosis not present

## 2019-08-10 DIAGNOSIS — M25562 Pain in left knee: Secondary | ICD-10-CM | POA: Diagnosis not present

## 2019-09-24 DIAGNOSIS — H52203 Unspecified astigmatism, bilateral: Secondary | ICD-10-CM | POA: Diagnosis not present

## 2019-09-24 DIAGNOSIS — H524 Presbyopia: Secondary | ICD-10-CM | POA: Diagnosis not present

## 2019-09-24 DIAGNOSIS — Z0100 Encounter for examination of eyes and vision without abnormal findings: Secondary | ICD-10-CM | POA: Diagnosis not present

## 2019-09-24 DIAGNOSIS — H5213 Myopia, bilateral: Secondary | ICD-10-CM | POA: Diagnosis not present

## 2019-09-24 DIAGNOSIS — H2513 Age-related nuclear cataract, bilateral: Secondary | ICD-10-CM | POA: Diagnosis not present

## 2019-10-16 DIAGNOSIS — R351 Nocturia: Secondary | ICD-10-CM | POA: Diagnosis not present

## 2019-10-16 DIAGNOSIS — R3912 Poor urinary stream: Secondary | ICD-10-CM | POA: Diagnosis not present

## 2019-10-16 DIAGNOSIS — N401 Enlarged prostate with lower urinary tract symptoms: Secondary | ICD-10-CM | POA: Diagnosis not present

## 2019-10-17 DIAGNOSIS — M25511 Pain in right shoulder: Secondary | ICD-10-CM | POA: Diagnosis not present

## 2019-10-17 DIAGNOSIS — M25512 Pain in left shoulder: Secondary | ICD-10-CM | POA: Diagnosis not present

## 2019-10-25 DIAGNOSIS — M25512 Pain in left shoulder: Secondary | ICD-10-CM | POA: Diagnosis not present

## 2019-10-25 DIAGNOSIS — M25511 Pain in right shoulder: Secondary | ICD-10-CM | POA: Diagnosis not present

## 2019-10-29 DIAGNOSIS — R69 Illness, unspecified: Secondary | ICD-10-CM | POA: Diagnosis not present

## 2019-11-28 DIAGNOSIS — N401 Enlarged prostate with lower urinary tract symptoms: Secondary | ICD-10-CM | POA: Diagnosis not present

## 2019-11-28 DIAGNOSIS — R351 Nocturia: Secondary | ICD-10-CM | POA: Diagnosis not present

## 2019-11-28 DIAGNOSIS — R3912 Poor urinary stream: Secondary | ICD-10-CM | POA: Diagnosis not present

## 2019-12-11 DIAGNOSIS — M25511 Pain in right shoulder: Secondary | ICD-10-CM | POA: Diagnosis not present

## 2019-12-11 DIAGNOSIS — M25512 Pain in left shoulder: Secondary | ICD-10-CM | POA: Diagnosis not present

## 2019-12-18 DIAGNOSIS — M75101 Unspecified rotator cuff tear or rupture of right shoulder, not specified as traumatic: Secondary | ICD-10-CM | POA: Diagnosis not present

## 2019-12-18 DIAGNOSIS — M19011 Primary osteoarthritis, right shoulder: Secondary | ICD-10-CM | POA: Diagnosis not present

## 2019-12-18 DIAGNOSIS — R69 Illness, unspecified: Secondary | ICD-10-CM | POA: Diagnosis not present

## 2020-01-03 DIAGNOSIS — G8918 Other acute postprocedural pain: Secondary | ICD-10-CM | POA: Diagnosis not present

## 2020-01-03 DIAGNOSIS — S46011A Strain of muscle(s) and tendon(s) of the rotator cuff of right shoulder, initial encounter: Secondary | ICD-10-CM | POA: Diagnosis not present

## 2020-01-07 DIAGNOSIS — M24111 Other articular cartilage disorders, right shoulder: Secondary | ICD-10-CM | POA: Diagnosis not present

## 2020-01-07 DIAGNOSIS — M19011 Primary osteoarthritis, right shoulder: Secondary | ICD-10-CM | POA: Diagnosis not present

## 2020-01-07 DIAGNOSIS — S46011A Strain of muscle(s) and tendon(s) of the rotator cuff of right shoulder, initial encounter: Secondary | ICD-10-CM | POA: Diagnosis not present

## 2020-01-07 DIAGNOSIS — Y999 Unspecified external cause status: Secondary | ICD-10-CM | POA: Diagnosis not present

## 2020-01-07 DIAGNOSIS — S43431A Superior glenoid labrum lesion of right shoulder, initial encounter: Secondary | ICD-10-CM | POA: Diagnosis not present

## 2020-01-07 DIAGNOSIS — M75121 Complete rotator cuff tear or rupture of right shoulder, not specified as traumatic: Secondary | ICD-10-CM | POA: Diagnosis not present

## 2020-01-07 DIAGNOSIS — M94211 Chondromalacia, right shoulder: Secondary | ICD-10-CM | POA: Diagnosis not present

## 2020-01-10 DIAGNOSIS — M25611 Stiffness of right shoulder, not elsewhere classified: Secondary | ICD-10-CM | POA: Diagnosis not present

## 2020-01-10 DIAGNOSIS — M25511 Pain in right shoulder: Secondary | ICD-10-CM | POA: Diagnosis not present

## 2020-01-14 DIAGNOSIS — Z0000 Encounter for general adult medical examination without abnormal findings: Secondary | ICD-10-CM | POA: Diagnosis not present

## 2020-01-14 DIAGNOSIS — H9319 Tinnitus, unspecified ear: Secondary | ICD-10-CM | POA: Diagnosis not present

## 2020-01-14 DIAGNOSIS — R350 Frequency of micturition: Secondary | ICD-10-CM | POA: Diagnosis not present

## 2020-01-14 DIAGNOSIS — Z125 Encounter for screening for malignant neoplasm of prostate: Secondary | ICD-10-CM | POA: Diagnosis not present

## 2020-01-14 DIAGNOSIS — N401 Enlarged prostate with lower urinary tract symptoms: Secondary | ICD-10-CM | POA: Diagnosis not present

## 2020-01-14 DIAGNOSIS — R3912 Poor urinary stream: Secondary | ICD-10-CM | POA: Diagnosis not present

## 2020-01-14 DIAGNOSIS — Z23 Encounter for immunization: Secondary | ICD-10-CM | POA: Diagnosis not present

## 2020-01-14 DIAGNOSIS — I700 Atherosclerosis of aorta: Secondary | ICD-10-CM | POA: Diagnosis not present

## 2020-01-14 DIAGNOSIS — R351 Nocturia: Secondary | ICD-10-CM | POA: Diagnosis not present

## 2020-01-14 DIAGNOSIS — I2510 Atherosclerotic heart disease of native coronary artery without angina pectoris: Secondary | ICD-10-CM | POA: Diagnosis not present

## 2020-01-14 DIAGNOSIS — E7800 Pure hypercholesterolemia, unspecified: Secondary | ICD-10-CM | POA: Diagnosis not present

## 2020-01-16 DIAGNOSIS — M25511 Pain in right shoulder: Secondary | ICD-10-CM | POA: Diagnosis not present

## 2020-01-16 DIAGNOSIS — M25611 Stiffness of right shoulder, not elsewhere classified: Secondary | ICD-10-CM | POA: Diagnosis not present

## 2020-01-18 DIAGNOSIS — M25511 Pain in right shoulder: Secondary | ICD-10-CM | POA: Diagnosis not present

## 2020-01-18 DIAGNOSIS — M25611 Stiffness of right shoulder, not elsewhere classified: Secondary | ICD-10-CM | POA: Diagnosis not present

## 2020-01-22 DIAGNOSIS — M25611 Stiffness of right shoulder, not elsewhere classified: Secondary | ICD-10-CM | POA: Diagnosis not present

## 2020-01-22 DIAGNOSIS — M25511 Pain in right shoulder: Secondary | ICD-10-CM | POA: Diagnosis not present

## 2020-01-24 DIAGNOSIS — M25611 Stiffness of right shoulder, not elsewhere classified: Secondary | ICD-10-CM | POA: Diagnosis not present

## 2020-01-24 DIAGNOSIS — M25511 Pain in right shoulder: Secondary | ICD-10-CM | POA: Diagnosis not present

## 2020-01-29 DIAGNOSIS — M25611 Stiffness of right shoulder, not elsewhere classified: Secondary | ICD-10-CM | POA: Diagnosis not present

## 2020-01-29 DIAGNOSIS — M25511 Pain in right shoulder: Secondary | ICD-10-CM | POA: Diagnosis not present

## 2020-01-31 DIAGNOSIS — R3912 Poor urinary stream: Secondary | ICD-10-CM | POA: Diagnosis not present

## 2020-01-31 DIAGNOSIS — N401 Enlarged prostate with lower urinary tract symptoms: Secondary | ICD-10-CM | POA: Diagnosis not present

## 2020-01-31 DIAGNOSIS — R351 Nocturia: Secondary | ICD-10-CM | POA: Diagnosis not present

## 2020-01-31 DIAGNOSIS — M25511 Pain in right shoulder: Secondary | ICD-10-CM | POA: Diagnosis not present

## 2020-01-31 DIAGNOSIS — M25611 Stiffness of right shoulder, not elsewhere classified: Secondary | ICD-10-CM | POA: Diagnosis not present

## 2020-01-31 DIAGNOSIS — B356 Tinea cruris: Secondary | ICD-10-CM | POA: Diagnosis not present

## 2020-02-05 DIAGNOSIS — M25511 Pain in right shoulder: Secondary | ICD-10-CM | POA: Diagnosis not present

## 2020-02-05 DIAGNOSIS — M25611 Stiffness of right shoulder, not elsewhere classified: Secondary | ICD-10-CM | POA: Diagnosis not present

## 2020-02-07 DIAGNOSIS — M25511 Pain in right shoulder: Secondary | ICD-10-CM | POA: Diagnosis not present

## 2020-02-07 DIAGNOSIS — M25611 Stiffness of right shoulder, not elsewhere classified: Secondary | ICD-10-CM | POA: Diagnosis not present

## 2020-02-13 DIAGNOSIS — M25511 Pain in right shoulder: Secondary | ICD-10-CM | POA: Diagnosis not present

## 2020-02-13 DIAGNOSIS — M25611 Stiffness of right shoulder, not elsewhere classified: Secondary | ICD-10-CM | POA: Diagnosis not present

## 2020-02-15 DIAGNOSIS — M25511 Pain in right shoulder: Secondary | ICD-10-CM | POA: Diagnosis not present

## 2020-02-15 DIAGNOSIS — M25611 Stiffness of right shoulder, not elsewhere classified: Secondary | ICD-10-CM | POA: Diagnosis not present

## 2020-02-18 DIAGNOSIS — M25611 Stiffness of right shoulder, not elsewhere classified: Secondary | ICD-10-CM | POA: Diagnosis not present

## 2020-02-18 DIAGNOSIS — M25511 Pain in right shoulder: Secondary | ICD-10-CM | POA: Diagnosis not present

## 2020-02-21 DIAGNOSIS — M25511 Pain in right shoulder: Secondary | ICD-10-CM | POA: Diagnosis not present

## 2020-02-21 DIAGNOSIS — M25611 Stiffness of right shoulder, not elsewhere classified: Secondary | ICD-10-CM | POA: Diagnosis not present

## 2020-02-25 DIAGNOSIS — M25511 Pain in right shoulder: Secondary | ICD-10-CM | POA: Diagnosis not present

## 2020-02-25 DIAGNOSIS — M25611 Stiffness of right shoulder, not elsewhere classified: Secondary | ICD-10-CM | POA: Diagnosis not present

## 2020-02-28 DIAGNOSIS — M25511 Pain in right shoulder: Secondary | ICD-10-CM | POA: Diagnosis not present

## 2020-02-28 DIAGNOSIS — M25611 Stiffness of right shoulder, not elsewhere classified: Secondary | ICD-10-CM | POA: Diagnosis not present

## 2020-03-03 DIAGNOSIS — M25611 Stiffness of right shoulder, not elsewhere classified: Secondary | ICD-10-CM | POA: Diagnosis not present

## 2020-03-03 DIAGNOSIS — M25511 Pain in right shoulder: Secondary | ICD-10-CM | POA: Diagnosis not present

## 2020-03-06 DIAGNOSIS — M25511 Pain in right shoulder: Secondary | ICD-10-CM | POA: Diagnosis not present

## 2020-03-06 DIAGNOSIS — M25611 Stiffness of right shoulder, not elsewhere classified: Secondary | ICD-10-CM | POA: Diagnosis not present

## 2020-03-10 DIAGNOSIS — M25511 Pain in right shoulder: Secondary | ICD-10-CM | POA: Diagnosis not present

## 2020-03-10 DIAGNOSIS — M25611 Stiffness of right shoulder, not elsewhere classified: Secondary | ICD-10-CM | POA: Diagnosis not present

## 2020-03-13 DIAGNOSIS — M25511 Pain in right shoulder: Secondary | ICD-10-CM | POA: Diagnosis not present

## 2020-03-13 DIAGNOSIS — M25611 Stiffness of right shoulder, not elsewhere classified: Secondary | ICD-10-CM | POA: Diagnosis not present

## 2020-03-17 DIAGNOSIS — R351 Nocturia: Secondary | ICD-10-CM | POA: Diagnosis not present

## 2020-03-17 DIAGNOSIS — R3912 Poor urinary stream: Secondary | ICD-10-CM | POA: Diagnosis not present

## 2020-03-17 DIAGNOSIS — M25611 Stiffness of right shoulder, not elsewhere classified: Secondary | ICD-10-CM | POA: Diagnosis not present

## 2020-03-17 DIAGNOSIS — N200 Calculus of kidney: Secondary | ICD-10-CM | POA: Diagnosis not present

## 2020-03-17 DIAGNOSIS — M25511 Pain in right shoulder: Secondary | ICD-10-CM | POA: Diagnosis not present

## 2020-03-17 DIAGNOSIS — N401 Enlarged prostate with lower urinary tract symptoms: Secondary | ICD-10-CM | POA: Diagnosis not present

## 2020-03-20 DIAGNOSIS — M25511 Pain in right shoulder: Secondary | ICD-10-CM | POA: Diagnosis not present

## 2020-03-20 DIAGNOSIS — M25611 Stiffness of right shoulder, not elsewhere classified: Secondary | ICD-10-CM | POA: Diagnosis not present

## 2020-03-24 DIAGNOSIS — M25511 Pain in right shoulder: Secondary | ICD-10-CM | POA: Diagnosis not present

## 2020-03-24 DIAGNOSIS — M25611 Stiffness of right shoulder, not elsewhere classified: Secondary | ICD-10-CM | POA: Diagnosis not present

## 2020-03-27 DIAGNOSIS — M25511 Pain in right shoulder: Secondary | ICD-10-CM | POA: Diagnosis not present

## 2020-03-27 DIAGNOSIS — M25611 Stiffness of right shoulder, not elsewhere classified: Secondary | ICD-10-CM | POA: Diagnosis not present

## 2020-03-31 DIAGNOSIS — M25511 Pain in right shoulder: Secondary | ICD-10-CM | POA: Diagnosis not present

## 2020-03-31 DIAGNOSIS — M25611 Stiffness of right shoulder, not elsewhere classified: Secondary | ICD-10-CM | POA: Diagnosis not present

## 2020-04-03 DIAGNOSIS — M25511 Pain in right shoulder: Secondary | ICD-10-CM | POA: Diagnosis not present

## 2020-04-03 DIAGNOSIS — M25611 Stiffness of right shoulder, not elsewhere classified: Secondary | ICD-10-CM | POA: Diagnosis not present

## 2020-04-07 DIAGNOSIS — M25611 Stiffness of right shoulder, not elsewhere classified: Secondary | ICD-10-CM | POA: Diagnosis not present

## 2020-04-07 DIAGNOSIS — M25511 Pain in right shoulder: Secondary | ICD-10-CM | POA: Diagnosis not present

## 2020-04-10 DIAGNOSIS — M25611 Stiffness of right shoulder, not elsewhere classified: Secondary | ICD-10-CM | POA: Diagnosis not present

## 2020-04-10 DIAGNOSIS — M25511 Pain in right shoulder: Secondary | ICD-10-CM | POA: Diagnosis not present

## 2020-04-15 DIAGNOSIS — M25611 Stiffness of right shoulder, not elsewhere classified: Secondary | ICD-10-CM | POA: Diagnosis not present

## 2020-04-15 DIAGNOSIS — M25511 Pain in right shoulder: Secondary | ICD-10-CM | POA: Diagnosis not present

## 2020-04-17 DIAGNOSIS — M25511 Pain in right shoulder: Secondary | ICD-10-CM | POA: Diagnosis not present

## 2020-04-17 DIAGNOSIS — M25611 Stiffness of right shoulder, not elsewhere classified: Secondary | ICD-10-CM | POA: Diagnosis not present

## 2020-04-21 DIAGNOSIS — M25611 Stiffness of right shoulder, not elsewhere classified: Secondary | ICD-10-CM | POA: Diagnosis not present

## 2020-04-21 DIAGNOSIS — M25511 Pain in right shoulder: Secondary | ICD-10-CM | POA: Diagnosis not present

## 2020-04-24 DIAGNOSIS — M25611 Stiffness of right shoulder, not elsewhere classified: Secondary | ICD-10-CM | POA: Diagnosis not present

## 2020-04-24 DIAGNOSIS — M25511 Pain in right shoulder: Secondary | ICD-10-CM | POA: Diagnosis not present

## 2020-04-28 DIAGNOSIS — M25511 Pain in right shoulder: Secondary | ICD-10-CM | POA: Diagnosis not present

## 2020-05-01 DIAGNOSIS — M25511 Pain in right shoulder: Secondary | ICD-10-CM | POA: Diagnosis not present

## 2020-05-01 DIAGNOSIS — M25611 Stiffness of right shoulder, not elsewhere classified: Secondary | ICD-10-CM | POA: Diagnosis not present

## 2020-05-08 DIAGNOSIS — M25511 Pain in right shoulder: Secondary | ICD-10-CM | POA: Diagnosis not present

## 2020-05-08 DIAGNOSIS — M25611 Stiffness of right shoulder, not elsewhere classified: Secondary | ICD-10-CM | POA: Diagnosis not present

## 2020-05-15 DIAGNOSIS — M2651 Abnormal jaw closure: Secondary | ICD-10-CM | POA: Diagnosis not present

## 2020-05-15 DIAGNOSIS — R0989 Other specified symptoms and signs involving the circulatory and respiratory systems: Secondary | ICD-10-CM | POA: Diagnosis not present

## 2020-06-11 DIAGNOSIS — Z9889 Other specified postprocedural states: Secondary | ICD-10-CM | POA: Diagnosis not present

## 2020-06-11 DIAGNOSIS — M25511 Pain in right shoulder: Secondary | ICD-10-CM | POA: Diagnosis not present

## 2020-06-17 DIAGNOSIS — H938X3 Other specified disorders of ear, bilateral: Secondary | ICD-10-CM | POA: Diagnosis not present

## 2020-06-17 DIAGNOSIS — J3489 Other specified disorders of nose and nasal sinuses: Secondary | ICD-10-CM | POA: Diagnosis not present

## 2020-06-17 DIAGNOSIS — R0989 Other specified symptoms and signs involving the circulatory and respiratory systems: Secondary | ICD-10-CM | POA: Diagnosis not present

## 2020-07-03 DIAGNOSIS — R351 Nocturia: Secondary | ICD-10-CM | POA: Diagnosis not present

## 2020-07-03 DIAGNOSIS — N401 Enlarged prostate with lower urinary tract symptoms: Secondary | ICD-10-CM | POA: Diagnosis not present

## 2020-07-03 DIAGNOSIS — R3912 Poor urinary stream: Secondary | ICD-10-CM | POA: Diagnosis not present

## 2020-09-22 DIAGNOSIS — N3941 Urge incontinence: Secondary | ICD-10-CM | POA: Diagnosis not present

## 2020-09-29 DIAGNOSIS — E785 Hyperlipidemia, unspecified: Secondary | ICD-10-CM | POA: Diagnosis not present

## 2020-09-29 DIAGNOSIS — M1990 Unspecified osteoarthritis, unspecified site: Secondary | ICD-10-CM | POA: Diagnosis not present

## 2020-09-29 DIAGNOSIS — N529 Male erectile dysfunction, unspecified: Secondary | ICD-10-CM | POA: Diagnosis not present

## 2020-09-29 DIAGNOSIS — R32 Unspecified urinary incontinence: Secondary | ICD-10-CM | POA: Diagnosis not present

## 2020-09-29 DIAGNOSIS — K219 Gastro-esophageal reflux disease without esophagitis: Secondary | ICD-10-CM | POA: Diagnosis not present

## 2020-09-29 DIAGNOSIS — N400 Enlarged prostate without lower urinary tract symptoms: Secondary | ICD-10-CM | POA: Diagnosis not present

## 2020-09-29 DIAGNOSIS — N3281 Overactive bladder: Secondary | ICD-10-CM | POA: Diagnosis not present

## 2020-09-29 DIAGNOSIS — Z7722 Contact with and (suspected) exposure to environmental tobacco smoke (acute) (chronic): Secondary | ICD-10-CM | POA: Diagnosis not present

## 2020-10-27 DIAGNOSIS — H5213 Myopia, bilateral: Secondary | ICD-10-CM | POA: Diagnosis not present

## 2020-10-27 DIAGNOSIS — H52203 Unspecified astigmatism, bilateral: Secondary | ICD-10-CM | POA: Diagnosis not present

## 2020-10-27 DIAGNOSIS — H524 Presbyopia: Secondary | ICD-10-CM | POA: Diagnosis not present

## 2020-10-27 DIAGNOSIS — H2513 Age-related nuclear cataract, bilateral: Secondary | ICD-10-CM | POA: Diagnosis not present

## 2021-01-29 DIAGNOSIS — N3943 Post-void dribbling: Secondary | ICD-10-CM | POA: Diagnosis not present

## 2021-01-29 DIAGNOSIS — R3911 Hesitancy of micturition: Secondary | ICD-10-CM | POA: Diagnosis not present

## 2021-01-29 DIAGNOSIS — N401 Enlarged prostate with lower urinary tract symptoms: Secondary | ICD-10-CM | POA: Diagnosis not present

## 2021-03-05 DIAGNOSIS — L821 Other seborrheic keratosis: Secondary | ICD-10-CM | POA: Diagnosis not present

## 2021-03-05 DIAGNOSIS — D225 Melanocytic nevi of trunk: Secondary | ICD-10-CM | POA: Diagnosis not present

## 2021-03-05 DIAGNOSIS — D485 Neoplasm of uncertain behavior of skin: Secondary | ICD-10-CM | POA: Diagnosis not present

## 2021-03-05 DIAGNOSIS — L280 Lichen simplex chronicus: Secondary | ICD-10-CM | POA: Diagnosis not present

## 2021-03-05 DIAGNOSIS — L814 Other melanin hyperpigmentation: Secondary | ICD-10-CM | POA: Diagnosis not present

## 2021-03-26 DIAGNOSIS — H5213 Myopia, bilateral: Secondary | ICD-10-CM | POA: Diagnosis not present

## 2021-03-26 DIAGNOSIS — Z0100 Encounter for examination of eyes and vision without abnormal findings: Secondary | ICD-10-CM | POA: Diagnosis not present

## 2021-05-26 DIAGNOSIS — E7800 Pure hypercholesterolemia, unspecified: Secondary | ICD-10-CM | POA: Diagnosis not present

## 2021-05-26 DIAGNOSIS — I2510 Atherosclerotic heart disease of native coronary artery without angina pectoris: Secondary | ICD-10-CM | POA: Diagnosis not present

## 2021-05-26 DIAGNOSIS — N400 Enlarged prostate without lower urinary tract symptoms: Secondary | ICD-10-CM | POA: Diagnosis not present

## 2021-05-26 DIAGNOSIS — Z0000 Encounter for general adult medical examination without abnormal findings: Secondary | ICD-10-CM | POA: Diagnosis not present

## 2021-05-26 DIAGNOSIS — I700 Atherosclerosis of aorta: Secondary | ICD-10-CM | POA: Diagnosis not present

## 2021-05-26 DIAGNOSIS — G479 Sleep disorder, unspecified: Secondary | ICD-10-CM | POA: Diagnosis not present

## 2021-05-27 DIAGNOSIS — D485 Neoplasm of uncertain behavior of skin: Secondary | ICD-10-CM | POA: Diagnosis not present

## 2021-05-27 DIAGNOSIS — L905 Scar conditions and fibrosis of skin: Secondary | ICD-10-CM | POA: Diagnosis not present

## 2021-05-27 DIAGNOSIS — D225 Melanocytic nevi of trunk: Secondary | ICD-10-CM | POA: Diagnosis not present

## 2021-06-22 DIAGNOSIS — G4710 Hypersomnia, unspecified: Secondary | ICD-10-CM | POA: Diagnosis not present

## 2021-06-22 DIAGNOSIS — G4719 Other hypersomnia: Secondary | ICD-10-CM | POA: Diagnosis not present

## 2021-12-07 DIAGNOSIS — D1801 Hemangioma of skin and subcutaneous tissue: Secondary | ICD-10-CM | POA: Diagnosis not present

## 2021-12-07 DIAGNOSIS — Z09 Encounter for follow-up examination after completed treatment for conditions other than malignant neoplasm: Secondary | ICD-10-CM | POA: Diagnosis not present

## 2021-12-07 DIAGNOSIS — Z872 Personal history of diseases of the skin and subcutaneous tissue: Secondary | ICD-10-CM | POA: Diagnosis not present

## 2021-12-07 DIAGNOSIS — L821 Other seborrheic keratosis: Secondary | ICD-10-CM | POA: Diagnosis not present

## 2021-12-07 DIAGNOSIS — L814 Other melanin hyperpigmentation: Secondary | ICD-10-CM | POA: Diagnosis not present

## 2022-02-04 DIAGNOSIS — R69 Illness, unspecified: Secondary | ICD-10-CM | POA: Diagnosis not present

## 2022-02-22 DIAGNOSIS — N401 Enlarged prostate with lower urinary tract symptoms: Secondary | ICD-10-CM | POA: Diagnosis not present

## 2022-03-01 DIAGNOSIS — R3915 Urgency of urination: Secondary | ICD-10-CM | POA: Diagnosis not present

## 2022-03-01 DIAGNOSIS — N3943 Post-void dribbling: Secondary | ICD-10-CM | POA: Diagnosis not present

## 2022-03-01 DIAGNOSIS — N401 Enlarged prostate with lower urinary tract symptoms: Secondary | ICD-10-CM | POA: Diagnosis not present

## 2022-04-01 DIAGNOSIS — Z7189 Other specified counseling: Secondary | ICD-10-CM | POA: Diagnosis not present

## 2022-04-15 DIAGNOSIS — H524 Presbyopia: Secondary | ICD-10-CM | POA: Diagnosis not present

## 2022-04-15 DIAGNOSIS — H52203 Unspecified astigmatism, bilateral: Secondary | ICD-10-CM | POA: Diagnosis not present

## 2022-04-15 DIAGNOSIS — H5213 Myopia, bilateral: Secondary | ICD-10-CM | POA: Diagnosis not present

## 2022-04-15 DIAGNOSIS — Z0100 Encounter for examination of eyes and vision without abnormal findings: Secondary | ICD-10-CM | POA: Diagnosis not present

## 2022-06-14 DIAGNOSIS — R208 Other disturbances of skin sensation: Secondary | ICD-10-CM | POA: Diagnosis not present

## 2022-06-14 DIAGNOSIS — L821 Other seborrheic keratosis: Secondary | ICD-10-CM | POA: Diagnosis not present

## 2022-06-14 DIAGNOSIS — Z872 Personal history of diseases of the skin and subcutaneous tissue: Secondary | ICD-10-CM | POA: Diagnosis not present

## 2022-06-14 DIAGNOSIS — B078 Other viral warts: Secondary | ICD-10-CM | POA: Diagnosis not present

## 2022-06-14 DIAGNOSIS — L538 Other specified erythematous conditions: Secondary | ICD-10-CM | POA: Diagnosis not present

## 2022-06-14 DIAGNOSIS — D225 Melanocytic nevi of trunk: Secondary | ICD-10-CM | POA: Diagnosis not present

## 2022-06-14 DIAGNOSIS — Z789 Other specified health status: Secondary | ICD-10-CM | POA: Diagnosis not present

## 2022-06-14 DIAGNOSIS — L814 Other melanin hyperpigmentation: Secondary | ICD-10-CM | POA: Diagnosis not present

## 2022-06-15 DIAGNOSIS — Z5181 Encounter for therapeutic drug level monitoring: Secondary | ICD-10-CM | POA: Diagnosis not present

## 2022-06-15 DIAGNOSIS — Z0000 Encounter for general adult medical examination without abnormal findings: Secondary | ICD-10-CM | POA: Diagnosis not present

## 2022-06-15 DIAGNOSIS — Z125 Encounter for screening for malignant neoplasm of prostate: Secondary | ICD-10-CM | POA: Diagnosis not present

## 2022-06-15 DIAGNOSIS — Z23 Encounter for immunization: Secondary | ICD-10-CM | POA: Diagnosis not present

## 2022-06-15 DIAGNOSIS — E7800 Pure hypercholesterolemia, unspecified: Secondary | ICD-10-CM | POA: Diagnosis not present

## 2022-06-15 DIAGNOSIS — I700 Atherosclerosis of aorta: Secondary | ICD-10-CM | POA: Diagnosis not present

## 2022-06-15 DIAGNOSIS — R7989 Other specified abnormal findings of blood chemistry: Secondary | ICD-10-CM | POA: Diagnosis not present

## 2022-07-14 DIAGNOSIS — S90561A Insect bite (nonvenomous), right ankle, initial encounter: Secondary | ICD-10-CM | POA: Diagnosis not present

## 2022-07-14 DIAGNOSIS — R7989 Other specified abnormal findings of blood chemistry: Secondary | ICD-10-CM | POA: Diagnosis not present

## 2022-07-14 DIAGNOSIS — B078 Other viral warts: Secondary | ICD-10-CM | POA: Diagnosis not present

## 2022-07-14 DIAGNOSIS — Z789 Other specified health status: Secondary | ICD-10-CM | POA: Diagnosis not present

## 2022-07-14 DIAGNOSIS — R238 Other skin changes: Secondary | ICD-10-CM | POA: Diagnosis not present

## 2022-07-14 DIAGNOSIS — R944 Abnormal results of kidney function studies: Secondary | ICD-10-CM | POA: Diagnosis not present

## 2022-07-14 DIAGNOSIS — L538 Other specified erythematous conditions: Secondary | ICD-10-CM | POA: Diagnosis not present

## 2022-08-16 DIAGNOSIS — B078 Other viral warts: Secondary | ICD-10-CM | POA: Diagnosis not present

## 2022-08-16 DIAGNOSIS — L538 Other specified erythematous conditions: Secondary | ICD-10-CM | POA: Diagnosis not present

## 2022-08-16 DIAGNOSIS — R238 Other skin changes: Secondary | ICD-10-CM | POA: Diagnosis not present

## 2022-08-16 DIAGNOSIS — Z789 Other specified health status: Secondary | ICD-10-CM | POA: Diagnosis not present

## 2022-08-26 DIAGNOSIS — M5416 Radiculopathy, lumbar region: Secondary | ICD-10-CM | POA: Diagnosis not present

## 2022-08-26 DIAGNOSIS — Z6827 Body mass index (BMI) 27.0-27.9, adult: Secondary | ICD-10-CM | POA: Diagnosis not present

## 2022-08-26 DIAGNOSIS — M542 Cervicalgia: Secondary | ICD-10-CM | POA: Diagnosis not present

## 2022-08-26 DIAGNOSIS — M4316 Spondylolisthesis, lumbar region: Secondary | ICD-10-CM | POA: Diagnosis not present

## 2022-09-02 DIAGNOSIS — B070 Plantar wart: Secondary | ICD-10-CM | POA: Diagnosis not present

## 2022-09-02 DIAGNOSIS — L309 Dermatitis, unspecified: Secondary | ICD-10-CM | POA: Diagnosis not present

## 2022-09-17 ENCOUNTER — Ambulatory Visit: Payer: Medicare HMO | Admitting: Podiatry

## 2022-09-17 DIAGNOSIS — B070 Plantar wart: Secondary | ICD-10-CM | POA: Diagnosis not present

## 2022-09-17 NOTE — Progress Notes (Unsigned)
  Subjective:  Patient ID: Roy Sullivan, male    DOB: 18-Aug-1950,  MRN: [id number]  Chief Complaint  Patient presents with   Plantar Warts    Wart Evalution - wart follow up, he has completed 3 crythotherapy treatments as well as 1 immunotherapy injection without significant results, he is interested in surgical removal. Dermatologist has been treated patient.     72 y.o. male presents for concern for wart evaluation.  Past Medical History:  Diagnosis Date   Dysrhythmia    pt unaware of diagnosis   History of kidney stones    Renal disorder     No Known Allergies  ROS: Negative except as per HPI above  Objective:  General: AAO x3, NAD  Dermatological:    Vascular:  Dorsalis Pedis artery and Posterior Tibial artery pedal pulses are 2/4 bilateral.  Capillary fill time < 3 sec to all digits.   Neruologic: Grossly intact via light touch bilateral. Protective threshold intact to all sites bilateral.   Musculoskeletal: No gross boney pedal deformities bilateral. No pain, crepitus, or limitation noted with foot and ankle range of motion bilateral. Muscular strength 5/5 in all groups tested bilateral.  Gait: Unassisted, Nonantalgic.   No images are attached to the encounter.  Radiographs:  Date: *** XR {right/left foot:16097} Weightbearing AP/Lateral/Oblique   Findings: {MP Foot XR:23762::"no fracture, dislocation, swelling or degenerative changes noted"} Assessment:  No diagnosis found.   Plan:  Patient was evaluated and treated and all questions answered.  #*** -***  No follow-ups on file.          Corinna Gab, DPM [facility]

## 2022-09-21 DIAGNOSIS — B070 Plantar wart: Secondary | ICD-10-CM | POA: Diagnosis not present

## 2022-09-21 DIAGNOSIS — L988 Other specified disorders of the skin and subcutaneous tissue: Secondary | ICD-10-CM | POA: Diagnosis not present

## 2022-09-28 DIAGNOSIS — M5416 Radiculopathy, lumbar region: Secondary | ICD-10-CM | POA: Diagnosis not present

## 2022-09-28 DIAGNOSIS — M542 Cervicalgia: Secondary | ICD-10-CM | POA: Diagnosis not present

## 2022-10-07 DIAGNOSIS — M5416 Radiculopathy, lumbar region: Secondary | ICD-10-CM | POA: Diagnosis not present

## 2022-10-07 DIAGNOSIS — M542 Cervicalgia: Secondary | ICD-10-CM | POA: Diagnosis not present

## 2022-10-12 DIAGNOSIS — B070 Plantar wart: Secondary | ICD-10-CM | POA: Diagnosis not present

## 2022-10-13 DIAGNOSIS — M5416 Radiculopathy, lumbar region: Secondary | ICD-10-CM | POA: Diagnosis not present

## 2022-10-13 DIAGNOSIS — M542 Cervicalgia: Secondary | ICD-10-CM | POA: Diagnosis not present

## 2022-10-26 DIAGNOSIS — M542 Cervicalgia: Secondary | ICD-10-CM | POA: Diagnosis not present

## 2022-10-26 DIAGNOSIS — M5416 Radiculopathy, lumbar region: Secondary | ICD-10-CM | POA: Diagnosis not present

## 2022-11-09 DIAGNOSIS — B070 Plantar wart: Secondary | ICD-10-CM | POA: Diagnosis not present

## 2022-11-17 ENCOUNTER — Other Ambulatory Visit (HOSPITAL_COMMUNITY): Payer: Self-pay | Admitting: Internal Medicine

## 2022-11-17 DIAGNOSIS — I2510 Atherosclerotic heart disease of native coronary artery without angina pectoris: Secondary | ICD-10-CM

## 2022-11-23 ENCOUNTER — Encounter (HOSPITAL_COMMUNITY): Payer: Self-pay | Admitting: Pulmonary Disease

## 2022-11-23 ENCOUNTER — Encounter (HOSPITAL_COMMUNITY)
Admission: EM | Disposition: A | Discharge status: Home / Self Care | Payer: Self-pay | Source: Home / Self Care | Attending: Internal Medicine

## 2022-11-23 ENCOUNTER — Emergency Department (HOSPITAL_COMMUNITY): Payer: Medicare HMO

## 2022-11-23 ENCOUNTER — Inpatient Hospital Stay (HOSPITAL_COMMUNITY)
Admission: EM | Admit: 2022-11-23 | Discharge: 2022-11-27 | DRG: 275 | Disposition: A | Discharge status: Home / Self Care | Payer: Medicare HMO | Attending: Internal Medicine | Admitting: Internal Medicine

## 2022-11-23 ENCOUNTER — Other Ambulatory Visit: Payer: Self-pay

## 2022-11-23 DIAGNOSIS — I469 Cardiac arrest, cause unspecified: Principal | ICD-10-CM | POA: Diagnosis present

## 2022-11-23 DIAGNOSIS — J9600 Acute respiratory failure, unspecified whether with hypoxia or hypercapnia: Secondary | ICD-10-CM | POA: Diagnosis not present

## 2022-11-23 DIAGNOSIS — Z7982 Long term (current) use of aspirin: Secondary | ICD-10-CM | POA: Diagnosis not present

## 2022-11-23 DIAGNOSIS — N1831 Chronic kidney disease, stage 3a: Secondary | ICD-10-CM | POA: Diagnosis not present

## 2022-11-23 DIAGNOSIS — I493 Ventricular premature depolarization: Secondary | ICD-10-CM | POA: Diagnosis not present

## 2022-11-23 DIAGNOSIS — D8685 Sarcoid myocarditis: Secondary | ICD-10-CM | POA: Diagnosis not present

## 2022-11-23 DIAGNOSIS — I4729 Other ventricular tachycardia: Secondary | ICD-10-CM | POA: Diagnosis not present

## 2022-11-23 DIAGNOSIS — Z4682 Encounter for fitting and adjustment of non-vascular catheter: Secondary | ICD-10-CM | POA: Diagnosis not present

## 2022-11-23 DIAGNOSIS — E872 Acidosis, unspecified: Secondary | ICD-10-CM | POA: Diagnosis present

## 2022-11-23 DIAGNOSIS — Z79899 Other long term (current) drug therapy: Secondary | ICD-10-CM

## 2022-11-23 DIAGNOSIS — R0989 Other specified symptoms and signs involving the circulatory and respiratory systems: Secondary | ICD-10-CM | POA: Diagnosis not present

## 2022-11-23 DIAGNOSIS — Z743 Need for continuous supervision: Secondary | ICD-10-CM | POA: Diagnosis not present

## 2022-11-23 DIAGNOSIS — R55 Syncope and collapse: Secondary | ICD-10-CM | POA: Diagnosis not present

## 2022-11-23 DIAGNOSIS — I898 Other specified noninfective disorders of lymphatic vessels and lymph nodes: Secondary | ICD-10-CM | POA: Diagnosis present

## 2022-11-23 DIAGNOSIS — I4901 Ventricular fibrillation: Secondary | ICD-10-CM | POA: Diagnosis not present

## 2022-11-23 DIAGNOSIS — Z87442 Personal history of urinary calculi: Secondary | ICD-10-CM

## 2022-11-23 DIAGNOSIS — I462 Cardiac arrest due to underlying cardiac condition: Secondary | ICD-10-CM | POA: Diagnosis present

## 2022-11-23 DIAGNOSIS — I472 Ventricular tachycardia, unspecified: Secondary | ICD-10-CM | POA: Insufficient documentation

## 2022-11-23 DIAGNOSIS — N179 Acute kidney failure, unspecified: Secondary | ICD-10-CM | POA: Diagnosis present

## 2022-11-23 DIAGNOSIS — R0689 Other abnormalities of breathing: Secondary | ICD-10-CM | POA: Diagnosis not present

## 2022-11-23 DIAGNOSIS — S0990XA Unspecified injury of head, initial encounter: Secondary | ICD-10-CM | POA: Diagnosis not present

## 2022-11-23 DIAGNOSIS — R4182 Altered mental status, unspecified: Secondary | ICD-10-CM | POA: Diagnosis not present

## 2022-11-23 DIAGNOSIS — K9429 Other complications of gastrostomy: Secondary | ICD-10-CM | POA: Diagnosis not present

## 2022-11-23 DIAGNOSIS — S199XXA Unspecified injury of neck, initial encounter: Secondary | ICD-10-CM | POA: Diagnosis not present

## 2022-11-23 DIAGNOSIS — E785 Hyperlipidemia, unspecified: Secondary | ICD-10-CM | POA: Diagnosis not present

## 2022-11-23 DIAGNOSIS — R0681 Apnea, not elsewhere classified: Secondary | ICD-10-CM | POA: Diagnosis not present

## 2022-11-23 DIAGNOSIS — R918 Other nonspecific abnormal finding of lung field: Secondary | ICD-10-CM | POA: Diagnosis not present

## 2022-11-23 DIAGNOSIS — I428 Other cardiomyopathies: Secondary | ICD-10-CM | POA: Diagnosis present

## 2022-11-23 DIAGNOSIS — W19XXXA Unspecified fall, initial encounter: Secondary | ICD-10-CM | POA: Diagnosis not present

## 2022-11-23 HISTORY — PX: LEFT HEART CATH AND CORONARY ANGIOGRAPHY: CATH118249

## 2022-11-23 LAB — COMPREHENSIVE METABOLIC PANEL
ALT: 47 U/L — ABNORMAL HIGH (ref 0–44)
AST: 49 U/L — ABNORMAL HIGH (ref 15–41)
Albumin: 3.5 g/dL (ref 3.5–5.0)
Alkaline Phosphatase: 56 U/L (ref 38–126)
Anion gap: 14 (ref 5–15)
BUN: 27 mg/dL — ABNORMAL HIGH (ref 8–23)
CO2: 19 mmol/L — ABNORMAL LOW (ref 22–32)
Calcium: 8.8 mg/dL — ABNORMAL LOW (ref 8.9–10.3)
Chloride: 108 mmol/L (ref 98–111)
Creatinine, Ser: 1.39 mg/dL — ABNORMAL HIGH (ref 0.61–1.24)
GFR, Estimated: 54 mL/min — ABNORMAL LOW (ref >60)
Glucose, Bld: 197 mg/dL — ABNORMAL HIGH (ref 70–99)
Potassium: 3.8 mmol/L (ref 3.5–5.1)
Sodium: 141 mmol/L (ref 135–145)
Total Bilirubin: 0.7 mg/dL (ref <1.2)
Total Protein: 6.1 g/dL — ABNORMAL LOW (ref 6.5–8.1)

## 2022-11-23 LAB — CBC WITH DIFFERENTIAL/PLATELET
Abs Immature Granulocytes: 0.13 10*3/uL — ABNORMAL HIGH (ref 0.00–0.07)
Basophils Absolute: 0 10*3/uL (ref 0.0–0.1)
Basophils Relative: 0 %
Eosinophils Absolute: 0.1 10*3/uL (ref 0.0–0.5)
Eosinophils Relative: 1 %
HCT: 41 % (ref 39.0–52.0)
Hemoglobin: 13.4 g/dL (ref 13.0–17.0)
Immature Granulocytes: 2 %
Lymphocytes Relative: 16 %
Lymphs Abs: 1.4 10*3/uL (ref 0.7–4.0)
MCH: 30 pg (ref 26.0–34.0)
MCHC: 32.7 g/dL (ref 30.0–36.0)
MCV: 91.9 fL (ref 80.0–100.0)
Monocytes Absolute: 0.6 10*3/uL (ref 0.1–1.0)
Monocytes Relative: 7 %
Neutro Abs: 6.4 10*3/uL (ref 1.7–7.7)
Neutrophils Relative %: 74 %
Platelets: 199 10*3/uL (ref 150–400)
RBC: 4.46 MIL/uL (ref 4.22–5.81)
RDW: 12.8 % (ref 11.5–15.5)
WBC: 8.6 10*3/uL (ref 4.0–10.5)
nRBC: 0 % (ref 0.0–0.2)

## 2022-11-23 LAB — TYPE AND SCREEN
ABO/RH(D): A POS
Antibody Screen: NEGATIVE

## 2022-11-23 LAB — LIPID PANEL
Cholesterol: 153 mg/dL (ref 0–200)
HDL: 63 mg/dL (ref >40)
LDL Cholesterol: 81 mg/dL (ref 0–99)
Total CHOL/HDL Ratio: 2.4 {ratio}
Triglycerides: 45 mg/dL (ref <150)
VLDL: 9 mg/dL (ref 0–40)

## 2022-11-23 LAB — MAGNESIUM: Magnesium: 1.8 mg/dL (ref 1.7–2.4)

## 2022-11-23 LAB — I-STAT ARTERIAL BLOOD GAS, ED
Acid-base deficit: 4 mmol/L — ABNORMAL HIGH (ref 0.0–2.0)
Bicarbonate: 22.9 mmol/L (ref 20.0–28.0)
Calcium, Ion: 1.21 mmol/L (ref 1.15–1.40)
HCT: 37 % — ABNORMAL LOW (ref 39.0–52.0)
Hemoglobin: 12.6 g/dL — ABNORMAL LOW (ref 13.0–17.0)
O2 Saturation: 100 %
Patient temperature: 96.5
Potassium: 3.4 mmol/L — ABNORMAL LOW (ref 3.5–5.1)
Sodium: 140 mmol/L (ref 135–145)
TCO2: 24 mmol/L (ref 22–32)
pCO2 arterial: 43.4 mm[Hg] (ref 32–48)
pH, Arterial: 7.324 — ABNORMAL LOW (ref 7.35–7.45)
pO2, Arterial: 497 mm[Hg] — ABNORMAL HIGH (ref 83–108)

## 2022-11-23 LAB — I-STAT CG4 LACTIC ACID, ED: Lactic Acid, Venous: 2.9 mmol/L (ref 0.5–1.9)

## 2022-11-23 LAB — PROTIME-INR
INR: 1.1 (ref 0.8–1.2)
Prothrombin Time: 14.4 s (ref 11.4–15.2)

## 2022-11-23 LAB — HEMOGLOBIN A1C
Hgb A1c MFr Bld: 5.5 % (ref 4.8–5.6)
Mean Plasma Glucose: 111.15 mg/dL

## 2022-11-23 LAB — APTT: aPTT: 27 s (ref 24–36)

## 2022-11-23 LAB — GLUCOSE, CAPILLARY
Glucose-Capillary: 118 mg/dL — ABNORMAL HIGH (ref 70–99)
Glucose-Capillary: 90 mg/dL (ref 70–99)

## 2022-11-23 LAB — TROPONIN I (HIGH SENSITIVITY)
Troponin I (High Sensitivity): 13 ng/L (ref <18)
Troponin I (High Sensitivity): 80 ng/L — ABNORMAL HIGH (ref <18)

## 2022-11-23 LAB — MRSA NEXT GEN BY PCR, NASAL: MRSA by PCR Next Gen: NOT DETECTED

## 2022-11-23 LAB — PHOSPHORUS: Phosphorus: 2.8 mg/dL (ref 2.5–4.6)

## 2022-11-23 LAB — ETHANOL: Alcohol, Ethyl (B): <10 mg/dL (ref <10)

## 2022-11-23 LAB — BRAIN NATRIURETIC PEPTIDE: B Natriuretic Peptide: 147 pg/mL — ABNORMAL HIGH (ref 0.0–100.0)

## 2022-11-23 SURGERY — LEFT HEART CATH AND CORONARY ANGIOGRAPHY
Anesthesia: LOCAL

## 2022-11-23 MED ORDER — ACETAMINOPHEN 325 MG PO TABS
650.0000 mg | ORAL_TABLET | ORAL | Status: DC | PRN
  Administered 2022-11-24 – 2022-11-25 (×3): 650 mg
  Filled 2022-11-23 (×4): qty 2

## 2022-11-23 MED ORDER — IOHEXOL 350 MG/ML SOLN
INTRAVENOUS | Status: DC | PRN
  Administered 2022-11-23: 82 mL

## 2022-11-23 MED ORDER — LABETALOL HCL 5 MG/ML IV SOLN: 10.0000 mg | INTRAVENOUS | Status: AC | PRN

## 2022-11-23 MED ORDER — HEPARIN SODIUM (PORCINE) 5000 UNIT/ML IJ SOLN
5000.0000 [IU] | Freq: Three times a day (TID) | INTRAMUSCULAR | Status: DC
  Administered 2022-11-24: 5000 [IU] via SUBCUTANEOUS
  Filled 2022-11-23: qty 1

## 2022-11-23 MED ORDER — PROPOFOL 1000 MG/100ML IV EMUL
5.0000 ug/kg/min | INTRAVENOUS | Status: DC
  Administered 2022-11-23: 15 ug/kg/min via INTRAVENOUS

## 2022-11-23 MED ORDER — MIDAZOLAM HCL 2 MG/2ML IJ SOLN
INTRAMUSCULAR | Status: AC
  Filled 2022-11-23: qty 2

## 2022-11-23 MED ORDER — HEPARIN SODIUM (PORCINE) 5000 UNIT/ML IJ SOLN: 5000.0000 [IU] | Freq: Three times a day (TID) | INTRAMUSCULAR | Status: DC

## 2022-11-23 MED ORDER — VERAPAMIL HCL 2.5 MG/ML IV SOLN
INTRAVENOUS | Status: AC
  Filled 2022-11-23: qty 2

## 2022-11-23 MED ORDER — SODIUM CHLORIDE 0.9 % IV SOLN
250.0000 mL | INTRAVENOUS | Status: DC | PRN
Start: 2022-11-23 — End: 2022-11-24

## 2022-11-23 MED ORDER — FENTANYL CITRATE PF 50 MCG/ML IJ SOSY
25.0000 ug | PREFILLED_SYRINGE | Freq: Once | INTRAMUSCULAR | Status: AC
  Administered 2022-11-23: 25 ug via INTRAVENOUS

## 2022-11-23 MED ORDER — HYDRALAZINE HCL 20 MG/ML IJ SOLN: 10.0000 mg | INTRAMUSCULAR | Status: AC | PRN

## 2022-11-23 MED ORDER — AMIODARONE LOAD VIA INFUSION
150.0000 mg | Freq: Once | INTRAVENOUS | Status: AC
  Filled 2022-11-23: qty 83.34

## 2022-11-23 MED ORDER — HEPARIN (PORCINE) IN NACL 1000-0.9 UT/500ML-% IV SOLN
INTRAVENOUS | Status: DC | PRN
  Administered 2022-11-23 (×2): 500 mL

## 2022-11-23 MED ORDER — PROPOFOL 1000 MG/100ML IV EMUL
INTRAVENOUS | Status: AC
  Filled 2022-11-23: qty 100

## 2022-11-23 MED ORDER — FAMOTIDINE 20 MG PO TABS: 20.0000 mg | ORAL_TABLET | Freq: Two times a day (BID) | ORAL | Status: DC

## 2022-11-23 MED ORDER — VERAPAMIL HCL 2.5 MG/ML IV SOLN: INTRAVENOUS | Status: DC | PRN

## 2022-11-23 MED ORDER — CHLORHEXIDINE GLUCONATE CLOTH 2 % EX PADS
6.0000 | MEDICATED_PAD | Freq: Every day | CUTANEOUS | Status: DC
  Administered 2022-11-24 – 2022-11-27 (×3): 6 via TOPICAL

## 2022-11-23 MED ORDER — AMIODARONE HCL IN DEXTROSE 360-4.14 MG/200ML-% IV SOLN
30.0000 mg/h | INTRAVENOUS | Status: DC
  Administered 2022-11-23 – 2022-11-24 (×2): 30 mg/h via INTRAVENOUS
  Filled 2022-11-23: qty 200

## 2022-11-23 MED ORDER — FENTANYL 2500MCG IN NS 250ML (10MCG/ML) PREMIX INFUSION
25.0000 ug/h | INTRAVENOUS | Status: DC
  Administered 2022-11-23: 25 ug/h via INTRAVENOUS
  Filled 2022-11-23: qty 250

## 2022-11-23 MED ORDER — ORAL CARE MOUTH RINSE: 15.0000 mL | OROMUCOSAL | Status: DC | PRN

## 2022-11-23 MED ORDER — SODIUM CHLORIDE 0.9% FLUSH
3.0000 mL | Freq: Two times a day (BID) | INTRAVENOUS | Status: DC
  Administered 2022-11-24 – 2022-11-26 (×5): 3 mL via INTRAVENOUS

## 2022-11-23 MED ORDER — POLYETHYLENE GLYCOL 3350 17 G PO PACK
17.0000 g | PACK | Freq: Every day | ORAL | Status: DC | PRN
Start: 2022-11-23 — End: 2022-11-25

## 2022-11-23 MED ORDER — LIDOCAINE HCL (PF) 1 % IJ SOLN
INTRAMUSCULAR | Status: AC
  Filled 2022-11-23: qty 30

## 2022-11-23 MED ORDER — ETOMIDATE 2 MG/ML IV SOLN
INTRAVENOUS | Status: DC | PRN
  Administered 2022-11-23: 20 mg via INTRAVENOUS

## 2022-11-23 MED ORDER — MIDAZOLAM HCL 2 MG/2ML IJ SOLN
INTRAMUSCULAR | Status: DC | PRN
  Administered 2022-11-23: 2 mg via INTRAVENOUS

## 2022-11-23 MED ORDER — HEPARIN SODIUM (PORCINE) 1000 UNIT/ML IJ SOLN
INTRAMUSCULAR | Status: AC
  Filled 2022-11-23: qty 10

## 2022-11-23 MED ORDER — SODIUM CHLORIDE 0.9 % IV BOLUS
1000.0000 mL | Freq: Once | INTRAVENOUS | Status: AC
  Administered 2022-11-23: 1000 mL via INTRAVENOUS

## 2022-11-23 MED ORDER — SODIUM CHLORIDE 0.9 % IV SOLN
INTRAVENOUS | Status: AC
Start: 2022-11-23 — End: 2022-11-23

## 2022-11-23 MED ORDER — SODIUM CHLORIDE 0.9% FLUSH: 3.0000 mL | INTRAVENOUS | Status: DC | PRN

## 2022-11-23 MED ORDER — HEPARIN SODIUM (PORCINE) 1000 UNIT/ML IJ SOLN
INTRAMUSCULAR | Status: DC | PRN
  Administered 2022-11-23: 5000 [IU] via INTRAVENOUS

## 2022-11-23 MED ORDER — FENTANYL CITRATE PF 50 MCG/ML IJ SOSY
25.0000 ug | PREFILLED_SYRINGE | INTRAMUSCULAR | Status: DC | PRN
  Administered 2022-11-23: 50 ug via INTRAVENOUS
  Filled 2022-11-23: qty 1

## 2022-11-23 MED ORDER — MAGNESIUM SULFATE 2 GM/50ML IV SOLN
2.0000 g | Freq: Once | INTRAVENOUS | Status: AC
  Administered 2022-11-23: 2 g via INTRAVENOUS
  Filled 2022-11-23: qty 50

## 2022-11-23 MED ORDER — ROCURONIUM BROMIDE 10 MG/ML (PF) SYRINGE
PREFILLED_SYRINGE | INTRAVENOUS | Status: DC | PRN
  Administered 2022-11-23: 100 mg via INTRAVENOUS

## 2022-11-23 MED ORDER — FENTANYL BOLUS VIA INFUSION: 25.0000 ug | INTRAVENOUS | Status: DC | PRN

## 2022-11-23 MED ORDER — DOCUSATE SODIUM 100 MG PO CAPS: 100.0000 mg | ORAL_CAPSULE | Freq: Two times a day (BID) | ORAL | Status: DC | PRN

## 2022-11-23 MED ORDER — AMIODARONE HCL IN DEXTROSE 360-4.14 MG/200ML-% IV SOLN
INTRAVENOUS | Status: AC
  Administered 2022-11-23: 150 mg via INTRAVENOUS
  Filled 2022-11-23: qty 200

## 2022-11-23 MED ORDER — FENTANYL CITRATE PF 50 MCG/ML IJ SOSY: 25.0000 ug | PREFILLED_SYRINGE | INTRAMUSCULAR | Status: DC | PRN

## 2022-11-23 MED ORDER — DOCUSATE SODIUM 50 MG/5ML PO LIQD: 100.0000 mg | Freq: Two times a day (BID) | ORAL | Status: DC | PRN

## 2022-11-23 MED ORDER — ONDANSETRON HCL 4 MG/2ML IJ SOLN
4.0000 mg | Freq: Four times a day (QID) | INTRAMUSCULAR | Status: DC | PRN
  Administered 2022-11-23 – 2022-11-24 (×2): 4 mg via INTRAVENOUS
  Filled 2022-11-23 (×2): qty 2

## 2022-11-23 MED ORDER — AMIODARONE HCL IN DEXTROSE 360-4.14 MG/200ML-% IV SOLN
60.0000 mg/h | INTRAVENOUS | Status: DC
  Administered 2022-11-23: 60 mg/h via INTRAVENOUS
  Filled 2022-11-23: qty 200

## 2022-11-23 SURGICAL SUPPLY — 8 items
CATH 5FR JL3.5 JR4 ANG PIG MP (CATHETERS) IMPLANT
DEVICE RAD COMP TR BAND LRG (VASCULAR PRODUCTS) IMPLANT
GLIDESHEATH SLEND SS 6F .021 (SHEATH) IMPLANT
GUIDEWIRE INQWIRE 1.5J.035X260 (WIRE) IMPLANT
INQWIRE 1.5J .035X260CM (WIRE) ×1
MAT PREVALON FULL STRYKER (MISCELLANEOUS) IMPLANT
PACK CARDIAC CATHETERIZATION (CUSTOM PROCEDURE TRAY) ×1 IMPLANT
SET ATX-X65L (MISCELLANEOUS) IMPLANT

## 2022-11-23 NOTE — Procedures (Signed)
Extubation Procedure Note  Patient Details:   Name: Roy Sullivan DOB: July 15, 1950 MRN: [id number]   Airway Documentation:    Vent end date: 11/23/22 Vent end time: 1751   Evaluation  O2 sats: stable throughout Complications: No apparent complications Patient did tolerate procedure well. Bilateral Breath Sounds: Clear, Diminished   Yes, Patient extubated per order. Cuff leak noted prior to extubation. Patient able to vocalize name. Strong cough. Currently 98% on room air.  Clent Ridges 11/23/2022, 5:54 PM

## 2022-11-23 NOTE — Progress Notes (Signed)
PCCM Update:  Patient tolerated cath well, no coronary disease noted. Mild segmental LV dysfunction with akinesis of the basal inferior wall, LVEF estimated 45-50%. Normal LVEDP. Concern for possible cardiac sarcoid, plan for cardiac MRI.   Will plan to extubate, he is alert/awake. Tolerating PSV trial.   Melody Comas, MD [facility] Office: [phone]   See Amion for personal pager PCCM on call pager [phone] until 7pm. Please call Elink 7p-7a. [phone]

## 2022-11-23 NOTE — ED Provider Notes (Signed)
[place] EMERGENCY DEPARTMENT AT [facility] Provider Note   CSN: [id number] Arrival date & time: 11/23/22  1317     History  No chief complaint on file.   Roy Sullivan is a 72 y.o. male.  HPI Patient presents in extremis via EMS.  History per EMS, eventually the patient is joined by his wife and sister.  Patient is generally well, was on a treadmill at the gym, when he had a witnessed collapse.  Per EMS the patient received 2 shocks, subsequently CPR, unclear what the initial rhythm was.  After approximately 5 minutes patient had return of spontaneous circulation, but no return to appropriate interactivity.  He arrives moving his extremities spontaneously, not following commands, not protecting his airway.  Level 5 caveat, acuity of condition.    Home Medications Prior to Admission medications   Medication Sig Start Date End Date Taking? Authorizing Provider  aspirin 325 MG EC tablet Take 325 mg by mouth every morning.     [provider]  fish oil-omega-3 fatty acids 1000 MG capsule Take 1 g by mouth daily.    [provider]  ibuprofen (ADVIL,MOTRIN) 200 MG tablet Take 400 mg by mouth every 6 (six) hours as needed. Pain    [provider]  Multiple Vitamin (MULITIVITAMIN WITH MINERALS) TABS Take 1 tablet by mouth daily.    [provider]  ondansetron (ZOFRAN ODT) 4 MG disintegrating tablet Take 1 tablet (4 mg total) by mouth every 8 (eight) hours as needed for nausea or vomiting. 05/14/18   Shaune Pollack, MD  oxyCODONE-acetaminophen (PERCOCET/ROXICET) 5-325 MG tablet Take 1-2 tablets by mouth every 6 (six) hours as needed for moderate pain or severe pain. 05/14/18   Shaune Pollack, MD  oxyCODONE-acetaminophen (PERCOCET/ROXICET) 5-325 MG tablet Take 1-2 tablets by mouth every 6 (six) hours as needed for severe pain. 02/18/19   Raeford Razor, MD  promethazine (PHENERGAN) 25 MG tablet Take 1 tablet (25 mg total) by mouth every 6 (six)  hours as needed for nausea or vomiting. 02/18/19   Raeford Razor, MD  tamsulosin (FLOMAX) 0.4 MG CAPS capsule Take 0.4 mg by mouth. 07/26/18   [provider]      Allergies    Patient has no known allergies.    Review of Systems   Review of Systems  Physical Exam Updated Vital Signs BP 130/82   Pulse 95   Resp (!) 21   Ht 5\' 9"  (1.753 m)   Wt 84 kg   SpO2 100%   BMI 27.35 kg/m  Physical Exam Vitals and nursing note reviewed.  Constitutional:      General: He is in acute distress.     Appearance: He is well-developed. He is ill-appearing.  HENT:     Head: Normocephalic and atraumatic.  Eyes:     Conjunctiva/sclera: Conjunctivae normal.  Cardiovascular:     Rate and Rhythm: Regular rhythm. Tachycardia present.  Pulmonary:     Effort: Pulmonary effort is normal. No respiratory distress.     Breath sounds: No stridor.  Abdominal:     General: There is no distension.  Skin:    General: Skin is warm and dry.  Neurological:     Comments: Disconjugate gaze, does not follow commands, moves to painful stimuli and also moves his extremity spontaneously.  Psychiatric:        Cognition and Memory: Cognition is impaired. Memory is impaired.    ED Results / Procedures / Treatments   Labs (all  labs ordered are listed, but only abnormal results are displayed) Labs Reviewed  CBC WITH DIFFERENTIAL/PLATELET - Abnormal; Notable for the following components:      Result Value   Abs Immature Granulocytes 0.13 (*)    All other components within normal limits  I-STAT CG4 LACTIC ACID, ED - Abnormal; Notable for the following components:   Lactic Acid, Venous 2.9 (*)    All other components within normal limits  I-STAT ARTERIAL BLOOD GAS, ED - Abnormal; Notable for the following components:   pH, Arterial 7.324 (*)    pO2, Arterial 497 (*)    Acid-base deficit 4.0 (*)    Potassium 3.4 (*)    HCT 37.0 (*)    Hemoglobin 12.6 (*)    All other components within normal limits   HEMOGLOBIN A1C  PROTIME-INR  APTT  COMPREHENSIVE METABOLIC PANEL  LIPID PANEL  MAGNESIUM  TROPONIN I (HIGH SENSITIVITY)    EKG EKG Interpretation Date/Time:  Tuesday November 23 2022 13:25:25 EST Ventricular Rate:  107 PR Interval:  179 QRS Duration:  91 QT Interval:  372 QTC Calculation: 497 R Axis:   48  Text Interpretation: Sinus tachycardia Multiform ventricular premature complexes Borderline T wave abnormalities Borderline prolonged QT interval Confirmed by Gerhard Munch [id number]) on 11/23/2022 2:00:11 PM  Radiology CT Head Wo Contrast  Result Date: 11/23/2022 CLINICAL DATA:  Mental status change, unknown cause; Polytrauma, blunt arrest, fall, trauma EXAM: CT HEAD WITHOUT CONTRAST CT CERVICAL SPINE WITHOUT CONTRAST TECHNIQUE: Multidetector CT imaging of the head and cervical spine was performed following the standard protocol without intravenous contrast. Multiplanar CT image reconstructions of the cervical spine were also generated. RADIATION DOSE REDUCTION: This exam was performed according to the departmental dose-optimization program which includes automated exposure control, adjustment of the mA and/or kV according to patient size and/or use of iterative reconstruction technique. COMPARISON:  None Available. FINDINGS: CT HEAD FINDINGS Brain: No evidence of acute infarction, hemorrhage, hydrocephalus, extra-axial collection or mass lesion/mass effect. Vascular: No hyperdense vessel or unexpected calcification. Skull: Normal. Negative for fracture or focal lesion. Sinuses/Orbits: No middle ear or mastoid effusion. Paranasal sinuses are notable polypoid mucosal thickening in the left maxillary sinus. Orbits are unremarkable. Other: There is layering fluid in the oropharynx CT CERVICAL SPINE FINDINGS Alignment: Grade 1 anterolisthesis of C4 on C5. Skull base and vertebrae: No acute fracture. No primary bone lesion or focal pathologic process. Soft tissues and spinal canal: No  prevertebral fluid or swelling. No visible canal hematoma. Disc levels:  No evidence of high-grade spinal stenosis. Upper chest: Negative. Other: None IMPRESSION: 1. No acute intracranial abnormality. 2. No acute fracture or traumatic subluxation of the cervical spine. Electronically Signed   By: Lorenza Cambridge M.D.   On: 11/23/2022 14:20   CT Cervical Spine Wo Contrast  Result Date: 11/23/2022 CLINICAL DATA:  Mental status change, unknown cause; Polytrauma, blunt arrest, fall, trauma EXAM: CT HEAD WITHOUT CONTRAST CT CERVICAL SPINE WITHOUT CONTRAST TECHNIQUE: Multidetector CT imaging of the head and cervical spine was performed following the standard protocol without intravenous contrast. Multiplanar CT image reconstructions of the cervical spine were also generated. RADIATION DOSE REDUCTION: This exam was performed according to the departmental dose-optimization program which includes automated exposure control, adjustment of the mA and/or kV according to patient size and/or use of iterative reconstruction technique. COMPARISON:  None Available. FINDINGS: CT HEAD FINDINGS Brain: No evidence of acute infarction, hemorrhage, hydrocephalus, extra-axial collection or mass lesion/mass effect. Vascular: No hyperdense vessel or unexpected calcification. Skull:  Normal. Negative for fracture or focal lesion. Sinuses/Orbits: No middle ear or mastoid effusion. Paranasal sinuses are notable polypoid mucosal thickening in the left maxillary sinus. Orbits are unremarkable. Other: There is layering fluid in the oropharynx CT CERVICAL SPINE FINDINGS Alignment: Grade 1 anterolisthesis of C4 on C5. Skull base and vertebrae: No acute fracture. No primary bone lesion or focal pathologic process. Soft tissues and spinal canal: No prevertebral fluid or swelling. No visible canal hematoma. Disc levels:  No evidence of high-grade spinal stenosis. Upper chest: Negative. Other: None IMPRESSION: 1. No acute intracranial abnormality. 2.  No acute fracture or traumatic subluxation of the cervical spine. Electronically Signed   By: Lorenza Cambridge M.D.   On: 11/23/2022 14:20    Procedures Procedures    Medications Ordered in ED Medications  etomidate (AMIDATE) injection (20 mg Intravenous Given 11/23/22 1321)  rocuronium (ZEMURON) injection (100 mg Intravenous Given 11/23/22 1321)  amiodarone (NEXTERONE) 1.8 mg/mL load via infusion 150 mg (150 mg Intravenous Bolus from Bag 11/23/22 1335)    Followed by  amiodarone (NEXTERONE PREMIX) 360-4.14 MG/200ML-% (1.8 mg/mL) IV infusion (60 mg/hr Intravenous New Bag/Given 11/23/22 1338)    Followed by  amiodarone (NEXTERONE PREMIX) 360-4.14 MG/200ML-% (1.8 mg/mL) IV infusion (has no administration in time range)  propofol (DIPRIVAN) 1000 MG/100ML infusion (15 mcg/kg/min  84 kg Intravenous New Bag/Given 11/23/22 1325)  fentaNYL (SUBLIMAZE) injection 25 mcg (has no administration in time range)  fentaNYL (SUBLIMAZE) injection 25-100 mcg (has no administration in time range)  sodium chloride 0.9 % bolus 1,000 mL (1,000 mLs Intravenous New Bag/Given 11/23/22 1331)    ED Course/ Medical Decision Making/ A&P                                 Medical Decision Making Adult male arrives after witnessed arrest.  Patient has retained a spontaneous circulation, but no protection of the airway.  Patient placed on continuous monitoring, access obtained.  On bedside monitoring the patient has several episodes of nonsustained wide-complex polymorphic rhythm.  In general he has sinus rhythm.  With no preserved airway, concern for cardiac arrest, and arrhythmia patient had a loading of amiodarone, after RSI with successful intubation.  I subsequently discussed the patient's case with his family members, with her cardiology colleagues and with our critical care colleagues.  Suspicion for out-of-hospital cardiac arrest, possibly arrhythmogenic.  Head CT without acute bleed, cervical spine CT without  fracture.  Plan for catheterization today with cardiology.  Amount and/or Complexity of Data Reviewed Independent Historian: spouse and EMS Labs: ordered. Decision-making details documented in ED Course. Radiology: ordered and independent interpretation performed. Decision-making details documented in ED Course. ECG/medicine tests: ordered and independent interpretation performed. Decision-making details documented in ED Course.  Risk Prescription drug management. Decision regarding hospitalization. Diagnosis or treatment significantly limited by social determinants of health.  INTUBATION Performed by: Gerhard Munch  Required items: required blood products, implants, devices, and special equipment available Patient identity confirmed: provided demographic data and hospital-assigned identification number Time out: Immediately prior to procedure a "time out" was called to verify the correct patient, procedure, equipment, support staff and site/side marked as required.  Indications: airway protection / cardiac arrest  Intubation method: Glidescope Laryngoscopy   Preoxygenation: BVM  Sedatives: 20 Etomidate Paralytic: 100 Rocuronium  Tube Size: 7.5 cuffed  Post-procedure assessment: chest rise and ETCO2 monitor Breath sounds: equal and absent over the epigastrium Tube secured with: ETT holder  Chest x-ray interpreted by radiologist and me.  Chest x-ray findings: endotracheal tube in appropriate position  Patient tolerated the procedure well with no immediate complications.  CRITICAL CARE Performed by: Gerhard Munch Total critical care time: 35 minutes Critical care time was exclusive of separately billable procedures and treating other patients. Critical care was necessary to treat or prevent imminent or life-threatening deterioration. Critical care was time spent personally by me on the following activities: development of treatment plan with patient and/or surrogate as  well as nursing, discussions with consultants, evaluation of patient's response to treatment, examination of patient, obtaining history from patient or surrogate, ordering and performing treatments and interventions, ordering and review of laboratory studies, ordering and review of radiographic studies, pulse oximetry and re-evaluation of patient's condition. Final Clinical Impression(s) / ED Diagnoses Final diagnoses:  Cardiac arrest [facility])     Gerhard Munch, MD 11/23/22 1621

## 2022-11-23 NOTE — Progress Notes (Signed)
Patient transported to CT and back without complication. 

## 2022-11-23 NOTE — Consult Note (Signed)
NAME:  Roy Sullivan, MRN:  [id number], DOB:  1950-09-11, LOS: 0 ADMISSION DATE:  11/23/2022, CONSULTATION DATE:  11/12 REFERRING MD:  Dr. Jeraldine Loots, CHIEF COMPLAINT:  cardiac arrest   History of Present Illness:  Patient is a 72 yo M w/ hx of recurrent kidney stones, HLD presentst to [facility] ED on 11/12 w/ cardiac arrest.  Per family patient is relatively healthy and works out regularly. On 11/12 patient was working out on [employer], television/film set and collapsed. Patient unresponsive. AED placed on patient and required 2 shocks. No compressions given per EMS. ROSC 5 minutes. Transferred to [facility] ED. Patient moving spontaneously not following commands. Hemodynamically stable. Given poor mental status patient intubated. CT head no acute abnormality. EKG sinus tachy 107 w/ no ST elevation; qtc 497. Having episodes of vtach. Started on amio. Cards consulted. Pccm consulted.  Wife and sister at bedside. Consented for coronary catheterization.   Pertinent  Medical History   Past Medical History:  Diagnosis Date   Dysrhythmia    pt unaware of diagnosis   History of kidney stones    Renal disorder      Significant Hospital Events: Including procedures, antibiotic start and stop dates in addition to other pertinent events   11/12 post arrest  Interim History / Subjective:  See above  Objective   Blood pressure 130/82, pulse 95, resp. rate (!) 21, height 5\' 9"  (1.753 m), weight 84 kg, SpO2 100%.    Vent Mode: PRVC FiO2 (%):  [100 %] 100 % Set Rate:  [18 bmp] 18 bmp Vt Set:  [550 mL] 550 mL PEEP:  [5 cmH20] 5 cmH20 Plateau Pressure:  [13 cmH20] 13 cmH20  No intake or output data in the 24 hours ending 11/23/22 1340 Filed Weights   11/23/22 1326  Weight: 84 kg    Examination: General:  elderly male, intubated HEENT: MM pink/moist; ETT in place Neuro: alert, following all commands, moving all 4 extremities, PERRL CV: s1s2, rrr,, ectopy no m/r/g PULM:  clear to auscultation, no wheezing GI:  soft, bsx4 active  Extremities: warm/dry, no edema  Skin: no rashes or lesions    Resolved Hospital Problem list     Assessment & Plan:  Cardiac arrest: became unresponsive while working out in gym; CPR started and shocked twice w/ AED; ROSC 5-7 minutes Plan: -will admit to ICU w/ continuous telemetry monitoring -cards consulted; appreciate recs -plan for cath today -trend troponin and lactate -check CMP, Mag -echo -patient following commands; will hold on TTM   Acute respiratory failure due to above Plan: -ETT 6-7 cm above carina; will advance 3 cm -LTVV strategy with tidal volumes of 6-8 cc/kg ideal body weight -Wean PEEP/FiO2 for SpO2 >92% -VAP bundle in place -Daily SAT and SBT -PAD protocol in place, fentanyl and propofol -wean sedation for RASS goal 0 to -1 -Follow intermittent CXR and ABG PRN  CKD 3a Lactic Acidosis Metabolic Acidosis Plan: -Trend BMP / urinary output -Replace electrolytes as indicated -Avoid nephrotoxic agents, ensure adequate renal perfusion  Anterior Mediastinal Masses with Calcification - Stable since 2014, 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  Basic Metabolic Panel: No results for input(s): "NA", "K", "CL", "CO2", "GLUCOSE", "BUN", "CREATININE", "CALCIUM", "MG", "PHOS" in the last 168 hours. GFR: CrCl cannot be calculated (Patient's most recent lab result is older than the maximum 21 days allowed.). Recent Labs  Lab 11/23/22 1335  LATICACIDVEN 2.9*    Liver Function Tests: No results for input(s): "AST",  "ALT", "ALKPHOS", "BILITOT", "PROT", "ALBUMIN" in the last 168 hours. No results for input(s): "LIPASE", "AMYLASE" in the last 168 hours. No results for input(s): "AMMONIA" in the last 168 hours.  ABG No results found for: "PHART", "PCO2ART", "PO2ART", "HCO3", "TCO2", "ACIDBASEDEF", "O2SAT"   Coagulation Profile: No results for input(s): "INR", "PROTIME" in the last 168 hours.  Cardiac Enzymes: No results for input(s): "CKTOTAL", "CKMB", "CKMBINDEX", "TROPONINI" in the last 168 hours.  HbA1C: No results found for: "HGBA1C"  CBG: No results for input(s): "GLUCAP" in the last 168 hours.  Review of Systems:    Unable to obtain ROS due to being intubated.   Past Medical History:  He,  has a past medical history of Dysrhythmia, History of kidney stones, and Renal disorder.   Surgical History:   Past Surgical History:  Procedure Laterality Date   BACK SURGERY     1989 and1994   CYSTOSCOPY/RETROGRADE/URETEROSCOPY/STONE EXTRACTION WITH BASKET Left 05/01/2012   Procedure: CYSTOSCOPY/LEFT RETROGRADE PYELOGRAM/LEFT URETEROSCOPY/STONE EXTRACTION WITH BASKET;  Surgeon: Kathi Ludwig, MD;  Location: [facility];  Service: Urology;  Laterality: Left;      STONE BASKET EXTRACTION POSSIBLE JJ STENT PLACEMENT  C-ARM    EXTRACORPOREAL SHOCK WAVE LITHOTRIPSY Left 08/14/2018   Procedure: EXTRACORPOREAL SHOCK WAVE LITHOTRIPSY (ESWL);  Surgeon: Sebastian Ache, MD;  Location: [facility];  Service: Urology;  Laterality: Left;   HOLMIUM LASER APPLICATION Left 05/01/2012   Procedure: HOLMIUM LASER APPLICATION;  Surgeon: Kathi Ludwig, MD;  Location: [facility];  Service: Urology;  Laterality: Left;  FRAGMENTATION    LUMBAR LAMINECTOMY  1994, 1989   L4-5, S1   STONE EXTRACTION WITH BASKET  2001     Social History:   reports that he has never smoked. He has never used smokeless tobacco. He reports current alcohol use. He reports that he does not use drugs.   Family History:  His Family  history is unknown by patient.   Allergies No Known Allergies   Home Medications  Prior to Admission medications   Medication Sig Start Date End Date Taking? Authorizing Provider  aspirin 325 MG EC tablet Take 325 mg by mouth every morning.     [provider]  fish oil-omega-3 fatty acids 1000 MG capsule Take 1 g by mouth daily.    [provider]  ibuprofen (ADVIL,MOTRIN) 200 MG tablet Take 400 mg by mouth every 6 (six) hours as needed. Pain    [provider]  Multiple Vitamin (MULITIVITAMIN WITH MINERALS) TABS Take 1 tablet by mouth daily.    [provider]  ondansetron (ZOFRAN ODT) 4 MG disintegrating tablet Take 1 tablet (4 mg total) by mouth every 8 (eight) hours as needed for nausea or vomiting. 05/14/18   Shaune Pollack, MD  oxyCODONE-acetaminophen (PERCOCET/ROXICET) 5-325 MG tablet Take 1-2 tablets by mouth every 6 (six) hours as needed for moderate pain or severe pain. 05/14/18   Shaune Pollack, MD  oxyCODONE-acetaminophen (PERCOCET/ROXICET) 5-325 MG tablet Take 1-2 tablets by mouth every 6 (six) hours as needed for severe pain. 02/18/19   Raeford Razor, MD  promethazine (PHENERGAN) 25 MG tablet Take  1 tablet (25 mg total) by mouth every 6 (six) hours as needed for nausea or vomiting. 02/18/19   Raeford Razor, MD  tamsulosin (FLOMAX) 0.4 MG CAPS capsule Take 0.4 mg by mouth. 07/26/18   [provider]     Critical care time: 50 minutes    Melody Comas, MD [facility] Office: [phone]   See Amion for personal pager PCCM on call pager [phone] until 7pm. Please call Elink 7p-7a. [phone]

## 2022-11-23 NOTE — Progress Notes (Signed)
Patient transported from Cath lab to 2H26 without incident. FIO2 weaned to 40%.

## 2022-11-23 NOTE — H&P (Signed)
Cardiology Admission History and Physical   Patient ID: Roy Sullivan MRN: [id number]; DOB: 1950/11/25   Admission date: 11/23/2022  PCP:  Renford Dills, MD   [facility] Providers Cardiologist:  None        Chief Complaint:  cardiac arrest  Patient Profile:   Roy Sullivan is a 72 y.o. male with history of nephrolithiasis and calcified anterior mediastinal mass who is being seen 11/23/2022 for the evaluation of cardiac arrest.  History of Present Illness:   Roy Sullivan was in the gym exercising on the treadmill, as he does on a daily basis.  He had sudden collapse received bystander CPR and had to AED advised shocks.  He had return of spontaneous circulation within 5 minutes.  By the time EMS arrived his ECG showed normal sinus rhythm with PVCs and on the monitor he would have brief bursts of polymorphic VT.  On arrival to the emergency room he was moving spontaneously but not following commands or protecting his airway so he was endotracheally intubated.  Intravenous amiodarone was started by the ED staff due to bursts of polymorphic VT.  He remains somewhat agitated, requiring intravenous sedatives, but he will follow commands and has purposeful movements.  Initial ECG shows sinus rhythm with occasional PVCs and borderline QTc at 497 ms.  There is no evidence of ST segment deviation.  The head does not show any acute intracranial abnormality or any injury to the cervical spine.  Chest x-ray does not show evidence of heart failure.  Most labs are still pending.  Lactic acid was mildly elevated at 2.9.  He may have mild chronic kidney disease (creatinine in 2021 was 1.28 and today is 1.39).  He does not have a history of hypertension, diabetes, family history of premature CAD or smoking.  There is no lipid profile available for review.  He was planning a coronary calcium score in the near future not due to any complaints or new medical findings, but just because one  of his friends had had an unexpected coronary event.  Chest CTs performed in 2014 and 2018 showing unusual calcified mass, felt to probably be sequelae of previous granulomatous infection, just anterior to the ascending aorta, likely extra pericardial he in the right chest.  There is only very scanty calcification of the coronary arteries in the aorta.  He has a history of previous left ureteral stone causing mild left hydronephrosis.  The anterior mediastinal calcified mass was first diagnosed in 2014 during evaluation for kidney stones.   Past Medical History:  Diagnosis Date   Dysrhythmia    pt unaware of diagnosis   History of kidney stones    Renal disorder     Past Surgical History:  Procedure Laterality Date   BACK SURGERY     1989 and1994   CYSTOSCOPY/RETROGRADE/URETEROSCOPY/STONE EXTRACTION WITH BASKET Left 05/01/2012   Procedure: CYSTOSCOPY/LEFT RETROGRADE PYELOGRAM/LEFT URETEROSCOPY/STONE EXTRACTION WITH BASKET;  Surgeon: Kathi Ludwig, MD;  Location: [facility];  Service: Urology;  Laterality: Left;      STONE BASKET EXTRACTION POSSIBLE JJ STENT PLACEMENT  C-ARM    EXTRACORPOREAL SHOCK WAVE LITHOTRIPSY Left 08/14/2018   Procedure: EXTRACORPOREAL SHOCK WAVE LITHOTRIPSY (ESWL);  Surgeon: Sebastian Ache, MD;  Location: [facility];  Service: Urology;  Laterality: Left;   HOLMIUM LASER APPLICATION Left 05/01/2012   Procedure: HOLMIUM LASER APPLICATION;  Surgeon: Kathi Ludwig, MD;  Location: [facility];  Service: Urology;  Laterality: Left;  FRAGMENTATION    LUMBAR LAMINECTOMY  1994, 1989   L4-5, S1   STONE EXTRACTION WITH BASKET  2001     Medications Prior to Admission: Prior to Admission medications   Medication Sig Start Date End Date Taking? Authorizing Provider  aspirin 325 MG EC tablet Take 325 mg by mouth every morning.     [provider]  fish oil-omega-3 fatty acids 1000 MG capsule Take 1 g by mouth daily.    [provider]  ibuprofen  (ADVIL,MOTRIN) 200 MG tablet Take 400 mg by mouth every 6 (six) hours as needed. Pain    [provider]  Multiple Vitamin (MULITIVITAMIN WITH MINERALS) TABS Take 1 tablet by mouth daily.    [provider]  ondansetron (ZOFRAN ODT) 4 MG disintegrating tablet Take 1 tablet (4 mg total) by mouth every 8 (eight) hours as needed for nausea or vomiting. 05/14/18   Shaune Pollack, MD  oxyCODONE-acetaminophen (PERCOCET/ROXICET) 5-325 MG tablet Take 1-2 tablets by mouth every 6 (six) hours as needed for moderate pain or severe pain. 05/14/18   Shaune Pollack, MD  oxyCODONE-acetaminophen (PERCOCET/ROXICET) 5-325 MG tablet Take 1-2 tablets by mouth every 6 (six) hours as needed for severe pain. 02/18/19   Raeford Razor, MD  promethazine (PHENERGAN) 25 MG tablet Take 1 tablet (25 mg total) by mouth every 6 (six) hours as needed for nausea or vomiting. 02/18/19   Raeford Razor, MD  tamsulosin (FLOMAX) 0.4 MG CAPS capsule Take 0.4 mg by mouth. 07/26/18   [provider]     Allergies:   No Known Allergies  Social History:   Social History   Socioeconomic History   Marital status: Married    Spouse name: Not on file   Number of children: Not on file   Years of education: Not on file   Highest education level: Not on file  Occupational History   Not on file  Tobacco Use   Smoking status: Never   Smokeless tobacco: Never  Vaping Use   Vaping status: Never Used  Substance and Sexual Activity   Alcohol use: Yes    Comment: ocassional   Drug use: No   Sexual activity: Not on file    Comment: did not ask  Other Topics Concern   Not on file  Social History Narrative   Not on file   Social Determinants of Health   Financial Resource Strain: Not on file  Food Insecurity: Not on file  Transportation Needs: Not on file  Physical Activity: Not on file  Stress: Not on file  Social Connections: Not on file  Intimate Partner Violence: Not on file    Family History:   The  patient's Family history is unknown by patient.    ROS:  Please see the history of present illness.  All other ROS reviewed and negative.     Physical Exam/Data:   Vitals:   11/23/22 1322 11/23/22 1324 11/23/22 1325 11/23/22 1326  BP:      Pulse: 95 (!) 107 93 95  Resp: 16 (!) 23 18 (!) 21  SpO2: 100% 100% 100% 100%  Weight:    84 kg  Height:    5\' 9"  (1.753 m)   No intake or output data in the 24 hours ending 11/23/22 1459    11/23/2022    1:26 PM 08/14/2018    9:28 AM 08/11/2018    1:52 PM  Last 3 Weights  Weight (lbs) 185 lb 3 oz 177 lb 180 lb  Weight (kg) 84 kg 80.287 kg  81.647 kg     Body mass index is 27.35 kg/m.  General:  Well nourished, well developed, in no acute distress.  He appears fit for his age.  HEENT: normal Neck: Neck is in a brace, but no obvious JVD Vascular: No carotid bruits; Distal pulses 2+ bilaterally   Cardiac:  normal S1, S2; RRR; no murmur.  Frequent ectopy Lungs:  clear to auscultation bilaterally, no wheezing, rhonchi or rales  Abd: soft, nontender, no hepatomegaly  Ext: no edema Musculoskeletal:  No deformities, BUE and BLE strength normal and equal Skin: warm and dry  Neuro:   He is currently intubated and lightly sedated, moving all 4 limbs spontaneously. Psych:  N/A    EKG:  The ECG that was done today was personally reviewed and demonstrates normal sinus rhythm/mild sinus tachycardia, a couple of PVCs, no acute repolarization abnormalities but QTc prolonged at 497 ms  On telemetry he continues to have relatively frequent PVCs as well as 3-4 beat bursts of VT suggesting polymorphic VT  Relevant CV Studies: None available  Laboratory Data:  High Sensitivity Troponin:   Recent Labs  Lab 11/23/22 1327  TROPONINIHS 13      Chemistry Recent Labs  Lab 11/23/22 1327 11/23/22 1412  NA 141 140  K 3.8 3.4*  CL 108  --   CO2 19*  --   GLUCOSE 197*  --   BUN 27*  --   CREATININE 1.39*  --   CALCIUM 8.8*  --   GFRNONAA 54*  --    ANIONGAP 14  --     Recent Labs  Lab 11/23/22 1327  PROT 6.1*  ALBUMIN 3.5  AST 49*  ALT 47*  ALKPHOS 56  BILITOT 0.7   Lipids No results for input(s): "CHOL", "TRIG", "HDL", "LABVLDL", "LDLCALC", "CHOLHDL" in the last 168 hours. Hematology Recent Labs  Lab 11/23/22 1327 11/23/22 1412  WBC 8.6  --   RBC 4.46  --   HGB 13.4 12.6*  HCT 41.0 37.0*  MCV 91.9  --   MCH 30.0  --   MCHC 32.7  --   RDW 12.8  --   PLT 199  --    Thyroid No results for input(s): "TSH", "FREET4" in the last 168 hours. BNPNo results for input(s): "BNP", "PROBNP" in the last 168 hours.  DDimer No results for input(s): "DDIMER" in the last 168 hours.   Radiology/Studies:  CT Head Wo Contrast  Result Date: 11/23/2022 CLINICAL DATA:  Mental status change, unknown cause; Polytrauma, blunt arrest, fall, trauma EXAM: CT HEAD WITHOUT CONTRAST CT CERVICAL SPINE WITHOUT CONTRAST TECHNIQUE: Multidetector CT imaging of the head and cervical spine was performed following the standard protocol without intravenous contrast. Multiplanar CT image reconstructions of the cervical spine were also generated. RADIATION DOSE REDUCTION: This exam was performed according to the departmental dose-optimization program which includes automated exposure control, adjustment of the mA and/or kV according to patient size and/or use of iterative reconstruction technique. COMPARISON:  None Available. FINDINGS: CT HEAD FINDINGS Brain: No evidence of acute infarction, hemorrhage, hydrocephalus, extra-axial collection or mass lesion/mass effect. Vascular: No hyperdense vessel or unexpected calcification. Skull: Normal. Negative for fracture or focal lesion. Sinuses/Orbits: No middle ear or mastoid effusion. Paranasal sinuses are notable polypoid mucosal thickening in the left maxillary sinus. Orbits are unremarkable. Other: There is layering fluid in the oropharynx CT CERVICAL SPINE FINDINGS Alignment: Grade 1 anterolisthesis of C4 on C5.  Skull base and vertebrae: No acute fracture. No primary bone  lesion or focal pathologic process. Soft tissues and spinal canal: No prevertebral fluid or swelling. No visible canal hematoma. Disc levels:  No evidence of high-grade spinal stenosis. Upper chest: Negative. Other: None IMPRESSION: 1. No acute intracranial abnormality. 2. No acute fracture or traumatic subluxation of the cervical spine. Electronically Signed   By: Lorenza Cambridge M.D.   On: 11/23/2022 14:20   CT Cervical Spine Wo Contrast  Result Date: 11/23/2022 CLINICAL DATA:  Mental status change, unknown cause; Polytrauma, blunt arrest, fall, trauma EXAM: CT HEAD WITHOUT CONTRAST CT CERVICAL SPINE WITHOUT CONTRAST TECHNIQUE: Multidetector CT imaging of the head and cervical spine was performed following the standard protocol without intravenous contrast. Multiplanar CT image reconstructions of the cervical spine were also generated. RADIATION DOSE REDUCTION: This exam was performed according to the departmental dose-optimization program which includes automated exposure control, adjustment of the mA and/or kV according to patient size and/or use of iterative reconstruction technique. COMPARISON:  None Available. FINDINGS: CT HEAD FINDINGS Brain: No evidence of acute infarction, hemorrhage, hydrocephalus, extra-axial collection or mass lesion/mass effect. Vascular: No hyperdense vessel or unexpected calcification. Skull: Normal. Negative for fracture or focal lesion. Sinuses/Orbits: No middle ear or mastoid effusion. Paranasal sinuses are notable polypoid mucosal thickening in the left maxillary sinus. Orbits are unremarkable. Other: There is layering fluid in the oropharynx CT CERVICAL SPINE FINDINGS Alignment: Grade 1 anterolisthesis of C4 on C5. Skull base and vertebrae: No acute fracture. No primary bone lesion or focal pathologic process. Soft tissues and spinal canal: No prevertebral fluid or swelling. No visible canal hematoma. Disc levels:   No evidence of high-grade spinal stenosis. Upper chest: Negative. Other: None IMPRESSION: 1. No acute intracranial abnormality. 2. No acute fracture or traumatic subluxation of the cervical spine. Electronically Signed   By: Lorenza Cambridge M.D.   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  Currently he does not appear to have any findings that would suggest ST segment elevation myocardial infarction.  It looks like he will have very good neurological recovery.  Will plan cardiac catheterization, coronary angiography, possible revascularization today.  The procedure was discussed in detail with the patient's wife and she asked several questions which were answered.  She agrees to allow [place] to proceed. Possible CKD stage III A: Creatinine today is only marginally higher than it was on labs in 2021.  I do not think it precludes angiography. 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  It has been there for at least the last 10 years.  Most likely sequela of previous granulomatous lymphadenopathy. History of nephrolithiasis   Risk Assessment/Risk Scores:          Code Status: Full Code  Severity of Illness: The appropriate patient status for this patient is INPATIENT. Inpatient status is judged to be reasonable and necessary in order to provide the required intensity of service to ensure the patient's safety. The patient's presenting symptoms, physical exam findings, and initial radiographic and laboratory data in the context of their chronic comorbidities is felt to place them at high risk for further clinical deterioration. Furthermore, it is not anticipated that the patient will be medically stable for discharge from the hospital within 2 midnights of admission.   * I certify that at the  point of admission it is my clinical judgment  that the patient will require inpatient hospital care spanning beyond 2 midnights from the point of admission due to high intensity of service, high risk for further deterioration and high frequency of surveillance required.*   For questions or updates, please contact [facility] Please consult [email or web address] for contact info under     Signed, Thurmon Fair, MD  11/23/2022 2:59 PM

## 2022-11-23 NOTE — ED Notes (Signed)
Help get patient on the monitor did EKG shown to Dr Jeraldine Loots

## 2022-11-23 NOTE — ED Notes (Signed)
Port XR at bedside.

## 2022-11-23 NOTE — ED Triage Notes (Signed)
Patient arrives via [facility] after a witnessed fall off the stairmaster at [employer]. Per bystanders, patient was unresponsive. AED applied by bystanders at gym, shocked once with no CPR advised. Upon fire department arrival, one additional shock was delivered with no CPR advised. Per EMS, unknown rhythm prior to arrival, GCS 4 with fire. Per EMS, cardiac strip has bigeminy and trigeminy. Upon EMS arrival, patient is responsive and able to follow commands, GCS 11, assisting ventilations via BVM. Patient's respiratory rate 10, lung sounds clear, 2 NPA placed. C collar placed by EMS. Given 5mg  versed and restrained in route, pupils 5mm. Abrasion noted above right eyelid. 18 RFA, 16 LAC.  EMS vitals 180/90 BP CBG 117

## 2022-11-23 NOTE — Interval H&P Note (Signed)
History and Physical Interval Note:  11/23/2022 3:57 PM  Roy Sullivan  has presented today for surgery, with the diagnosis of urgent.  The various methods of treatment have been discussed with the patient and family. After consideration of risks, benefits and other options for treatment, the patient has consented to  Procedure(s): LEFT HEART CATH AND CORONARY ANGIOGRAPHY (N/A) as a surgical intervention.  The patient's history has been reviewed, patient examined, no change in status, stable for surgery.  I have reviewed the patient's chart and labs.  Questions were answered to the patient's satisfaction.     Tonny Bollman

## 2022-11-24 ENCOUNTER — Other Ambulatory Visit: Payer: Self-pay

## 2022-11-24 ENCOUNTER — Encounter (HOSPITAL_COMMUNITY): Payer: Self-pay | Admitting: Cardiovascular Disease

## 2022-11-24 ENCOUNTER — Inpatient Hospital Stay (HOSPITAL_COMMUNITY): Payer: Medicare HMO

## 2022-11-24 DIAGNOSIS — I469 Cardiac arrest, cause unspecified: Secondary | ICD-10-CM

## 2022-11-24 LAB — BASIC METABOLIC PANEL
Anion gap: 8 (ref 5–15)
BUN: 20 mg/dL (ref 8–23)
CO2: 23 mmol/L (ref 22–32)
Calcium: 8.6 mg/dL — ABNORMAL LOW (ref 8.9–10.3)
Chloride: 108 mmol/L (ref 98–111)
Creatinine, Ser: 1.22 mg/dL (ref 0.61–1.24)
GFR, Estimated: >60 mL/min (ref >60)
Glucose, Bld: 95 mg/dL (ref 70–99)
Potassium: 4.3 mmol/L (ref 3.5–5.1)
Sodium: 139 mmol/L (ref 135–145)

## 2022-11-24 LAB — ECHOCARDIOGRAM COMPLETE
AR max vel: 2.47 cm2
AV Area VTI: 2.69 cm2
AV Area mean vel: 2.42 cm2
AV Mean grad: 5 mm[Hg]
AV Peak grad: 9.1 mm[Hg]
Ao pk vel: 1.51 m/s
Area-P 1/2: 3.32 cm2
Calc EF: 59.4 %
Height: 69 in
S' Lateral: 4.6 cm
Single Plane A2C EF: 57.4 %
Single Plane A4C EF: 60.6 %
Weight: 2864.22 [oz_av]

## 2022-11-24 LAB — RAPID URINE DRUG SCREEN, HOSP PERFORMED
Amphetamines: NOT DETECTED
Barbiturates: NOT DETECTED
Benzodiazepines: POSITIVE — AB
Cocaine: NOT DETECTED
Opiates: NOT DETECTED
Tetrahydrocannabinol: NOT DETECTED

## 2022-11-24 LAB — CBC
HCT: 39 % (ref 39.0–52.0)
Hemoglobin: 12.8 g/dL — ABNORMAL LOW (ref 13.0–17.0)
MCH: 29.3 pg (ref 26.0–34.0)
MCHC: 32.8 g/dL (ref 30.0–36.0)
MCV: 89.2 fL (ref 80.0–100.0)
Platelets: 186 10*3/uL (ref 150–400)
RBC: 4.37 MIL/uL (ref 4.22–5.81)
RDW: 12.9 % (ref 11.5–15.5)
WBC: 8.3 10*3/uL (ref 4.0–10.5)
nRBC: 0 % (ref 0.0–0.2)

## 2022-11-24 LAB — URINALYSIS, ROUTINE W REFLEX MICROSCOPIC
Bilirubin Urine: NEGATIVE
Glucose, UA: NEGATIVE mg/dL
Ketones, ur: 20 mg/dL — AB
Leukocytes,Ua: NEGATIVE
Nitrite: NEGATIVE
Protein, ur: NEGATIVE mg/dL
RBC / HPF: >50 RBC/hpf (ref 0–5)
Specific Gravity, Urine: 1.023 (ref 1.005–1.030)
pH: 5 (ref 5.0–8.0)

## 2022-11-24 LAB — GLUCOSE, CAPILLARY
Glucose-Capillary: 112 mg/dL — ABNORMAL HIGH (ref 70–99)
Glucose-Capillary: 115 mg/dL — ABNORMAL HIGH (ref 70–99)
Glucose-Capillary: 143 mg/dL — ABNORMAL HIGH (ref 70–99)
Glucose-Capillary: 96 mg/dL (ref 70–99)
Glucose-Capillary: 92 mg/dL (ref 70–99)

## 2022-11-24 LAB — TRIGLYCERIDES: Triglycerides: 29 mg/dL (ref <150)

## 2022-11-24 LAB — ABO/RH: ABO/RH(D): A POS

## 2022-11-24 LAB — SEDIMENTATION RATE: Sed Rate: 6 mm/h (ref 0–16)

## 2022-11-24 LAB — MAGNESIUM: Magnesium: 2.2 mg/dL (ref 1.7–2.4)

## 2022-11-24 LAB — C-REACTIVE PROTEIN: CRP: 2.1 mg/dL — ABNORMAL HIGH (ref <1.0)

## 2022-11-24 MED ORDER — ENOXAPARIN SODIUM 40 MG/0.4ML IJ SOSY
40.0000 mg | PREFILLED_SYRINGE | Freq: Every day | INTRAMUSCULAR | Status: DC
  Administered 2022-11-24: 40 mg via SUBCUTANEOUS
  Filled 2022-11-24: qty 0.4

## 2022-11-24 MED ORDER — SODIUM CHLORIDE 0.9% FLUSH
10.0000 mL | Freq: Two times a day (BID) | INTRAVENOUS | Status: DC
  Administered 2022-11-24 – 2022-11-26 (×5): 10 mL

## 2022-11-24 MED ORDER — AMIODARONE HCL 200 MG PO TABS
400.0000 mg | ORAL_TABLET | Freq: Two times a day (BID) | ORAL | Status: DC
  Administered 2022-11-24 – 2022-11-27 (×6): 400 mg via ORAL
  Filled 2022-11-24 (×6): qty 2

## 2022-11-24 MED ORDER — MUPIROCIN 2 % EX OINT
1.0000 | TOPICAL_OINTMENT | Freq: Two times a day (BID) | CUTANEOUS | Status: DC
  Administered 2022-11-24: 1 via NASAL
  Filled 2022-11-24: qty 22

## 2022-11-24 MED ORDER — GADOBUTROL 1 MMOL/ML IV SOLN
10.0000 mL | Freq: Once | INTRAVENOUS | Status: AC | PRN
  Administered 2022-11-24: 10 mL via INTRAVENOUS

## 2022-11-24 NOTE — Evaluation (Signed)
Physical Therapy Evaluation Patient Details Name: Roy Sullivan MRN: [id number] DOB: [birth year] Today's Date: 11/24/2022  History of Present Illness  Patient is a 72 year old male presenting with cardiac arrest after collapse on the stair master, requiring AED with 2 shocks. Acute respiratory failure resolved. History of  kidney stones, HLD.   Clinical Impression  Patient is agreeable to PT evaluation. He is independent and active at baseline. He lives with his spouse.  Today, the patient complains of non-radiating sternal discomfort that does not increase with mobility. Heart rate in 48-50's at rest and up to 71 bpm with ambulation. Patient ambulated in the hallway without assistive device with CGA initially progressing to supervision with decreased gait velocity. No dizziness reported with upright activity. Recommend PT follow up after upcoming procedure to maximize independence and decrease caregiver burden. No PT needs anticipated after this hospital stay at this time.       If plan is discharge home, recommend the following: Assist for transportation   Can travel by private vehicle        Equipment Recommendations None recommended by PT  Recommendations for Other Services       Functional Status Assessment Patient has had a recent decline in their functional status and demonstrates the ability to make significant improvements in function in a reasonable and predictable amount of time.     Precautions / Restrictions Precautions Precautions: Fall Restrictions Weight Bearing Restrictions: No      Mobility  Bed Mobility Overal bed mobility: Needs Assistance Bed Mobility: Supine to Sit     Supine to sit: Supervision, HOB elevated     General bed mobility comments: increased time required    Transfers Overall transfer level: Needs assistance Equipment used: None Transfers: Sit to/from Stand Sit to Stand: Contact guard assist                 Ambulation/Gait Ambulation/Gait assistance: Contact guard assist, Supervision Gait Distance (Feet): 120 Feet Assistive device: None Gait Pattern/deviations: Step-through pattern Gait velocity: decreased     General Gait Details: slow but steady with no loss of balance without assistive device. heart rate up to 71bpm with ambulation, no shortness of breath or dizziness reported with activity. Sp02 95% on room air after walking  Stairs            Wheelchair Mobility     Tilt Bed    Modified Rankin (Stroke Patients Only)       Balance Overall balance assessment: Needs assistance Sitting-balance support: Feet supported Sitting balance-Leahy Scale: Good     Standing balance support: No upper extremity supported Standing balance-Leahy Scale: Fair                               Pertinent Vitals/Pain Pain Assessment Pain Assessment: Faces Faces Pain Scale: Hurts little more Pain Location: sternal area, non radiating Pain Descriptors / Indicators: Discomfort Pain Intervention(s): Limited activity within patient's tolerance, Monitored during session    Home Living Family/patient expects to be discharged to:: Private residence Living Arrangements: Spouse/significant other Available Help at Discharge: Family;Available PRN/intermittently Type of Home: House Home Access: Stairs to enter Entrance Stairs-Rails: None Entrance Stairs-Number of Steps: 3   Home Layout: One level;Laundry or work area in [employer]: None      Prior Function Prior Level of Function : Independent/Modified Independent;Working/employed;Driving             Mobility Comments: indep,  working as a IT trainer, does cardio daily ADLs Comments: indep     Extremity/Trunk Assessment   Upper Extremity Assessment Upper Extremity Assessment: Overall WFL for tasks assessed (mild sternal pain with mobility that was not increased with AROM of unilateral shoulder to 90 degrees.  symmetrical grip strength)    Lower Extremity Assessment Lower Extremity Assessment: Overall WFL for tasks assessed (5/5 knee extension, dorsiflexion, plantarflexion bilaterally)       Communication   Communication Communication: No apparent difficulties  Cognition                                                General Comments General comments (skin integrity, edema, etc.): heart rate noted to be in the upper 40's-50's at rest.    Exercises     Assessment/Plan    PT Assessment Patient needs continued PT services  PT Problem List Decreased strength;Decreased range of motion;Decreased activity tolerance;Decreased balance;Decreased mobility;Cardiopulmonary status limiting activity;Pain       PT Treatment Interventions DME instruction;Gait training;Stair training;Functional mobility training;Therapeutic exercise;Therapeutic activities;Patient/family education;Balance training    PT Goals (Current goals can be found in the Care Plan section)  Acute Rehab PT Goals Patient Stated Goal: to return to normal activity PT Goal Formulation: With patient Time For Goal Achievement: 12/01/22 Potential to Achieve Goals: Good    Frequency Min 1X/week     Co-evaluation               AM-PAC PT "6 Clicks" Mobility  Outcome Measure Help needed turning from your back to your side while in a flat bed without using bedrails?: None Help needed moving from lying on your back to sitting on the side of a flat bed without using bedrails?: A Little Help needed moving to and from a bed to a chair (including a wheelchair)?: A Little Help needed standing up from a chair using your arms (e.g., wheelchair or bedside chair)?: A Little Help needed to walk in hospital room?: A Little Help needed climbing 3-5 steps with a railing? : A Little 6 Click Score: 19    End of Session Equipment Utilized During Treatment: Gait belt Activity Tolerance: Patient tolerated treatment  well Patient left: in chair;with call bell/phone within reach;with family/visitor present Nurse Communication: Mobility status PT Visit Diagnosis: Difficulty in walking, not elsewhere classified (R26.2);Unsteadiness on feet (R26.81)    Time: 7829-5621 PT Time Calculation (min) (ACUTE ONLY): 20 min   Charges:   PT Evaluation $PT Eval Moderate Complexity: 1 Mod   PT General Charges $$ ACUTE PT VISIT: 1 Visit         Donna Bernard, PT, MPT   Ina Homes 11/24/2022, 10:58 AM

## 2022-11-24 NOTE — Consult Note (Addendum)
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 Royann Shivers, Cardiology.   72 y/o male w/ no prior cardiac history admitted for OOH cardiac arrest and VT.   Has h/o abnormal chest CTs performed in 2014 and 2018 showing unusual calcified mass, felt to probably be sequelae of previous granulomatous infection. Also w/ h/o nephrolithiasis.   Prior to yesterday's events, was in [place]. Active, working out at gym daily. Was at the gym yesterday, doing cardio workout and sustained witnessed cardiac arrest w/ immediate bystander CPR. AED advised shocks. Post ROSC EKGs showed NSR w/ frequent PVCs. No ST segment elevations. Also w/ burst of PMVT on tele monitor.   In the ED, he was moving spontaneously but not following commands. Intubated for airway protection. Had further runs of PMVT and placed on amio gtt. Initial K 3.8, Mg 1.8. Hs trop 13>>80. SCr 1.39 c/w previous baseline. AST 49>>ALT 47.   He was taken to cath lab which showed widely patent left dominant coronaries w/ minimal plaquing but no obstructive CAD. LVG showed mild segmental LV dysfunction w/ AK of the basal inferior wall, LVEF estimated 45-50%. LVEDP normal.  Formal echo and cMRI ordered and pending. AHF team consulted for further management.   He is now extubated. On amio gtt w/ orders to transition to PO today, 400 mg bid. Telemetry shows sinus brady, 52 bpm. No further VT. He is alert and oriented. Currently getting echo. Denies dyspnea. Daughter present at bedside.      Review of Systems: [y] = yes, [ ]  = no   General: Weight gain [ ] ; Weight loss [ ] ; Anorexia [ ] ; Fatigue [ ] ; Fever [ ] ; Chills [ ] ; Weakness [ ]   Cardiac: Chest pain/pressure [ ] ;  Resting SOB [ ] ; Exertional SOB [ ] ; Orthopnea [ ] ; Pedal Edema [ ] ; Palpitations [ ] ; Syncope [Y ]; Presyncope [ ] ; Paroxysmal nocturnal dyspnea[ ]   Pulmonary: Cough [ ] ; Wheezing[ ] ; Hemoptysis[ ] ; Sputum [ ] ; Snoring [ ]   GI: Vomiting[ ] ; Dysphagia[ ] ; Melena[ ] ; Hematochezia [ ] ; Heartburn[ ] ; Abdominal pain [ ] ; Constipation [ ] ; Diarrhea [ ] ; BRBPR [ ]   GU: Hematuria[ ] ; Dysuria [ ] ; Nocturia[ ]   Vascular: Pain in legs with walking [ ] ; Pain in feet with lying flat [ ] ; Non-healing sores [ ] ; Stroke [ ] ; TIA [ ] ; Slurred speech [ ] ;  Neuro: Headaches[ ] ; Vertigo[ ] ; Seizures[ ] ; Paresthesias[ ] ;Blurred vision [ ] ; Diplopia [ ] ; Vision changes [ ]   Ortho/Skin: Arthritis [ ] ; Joint pain [ ] ; Muscle pain [ ] ; Joint swelling [ ] ; Back Pain [ ] ; Rash [ ]   Psych: Depression[ ] ; Anxiety[ ]   Heme: Bleeding problems [ ] ; Clotting disorders [ ] ; Anemia [ ]   Endocrine: Diabetes [ ] ; Thyroid dysfunction[ ]   Home Medications Prior to Admission medications   Medication Sig Start Date End Date Taking? Authorizing Provider  fish oil-omega-3 fatty acids 1000 MG capsule Take 1 g by mouth daily.   Yes [provider]  Multiple Vitamin (MULITIVITAMIN WITH MINERALS) TABS Take 1 tablet by mouth daily.   Yes [provider]  tamsulosin (FLOMAX) 0.4 MG CAPS capsule Take 0.4 mg by mouth daily.  07/26/18  Yes [provider]    Past Medical History: Past Medical History:  Diagnosis Date   Dysrhythmia    pt unaware of diagnosis   History of kidney stones    Renal disorder     Past Surgical History: Past Surgical History:  Procedure Laterality Date   BACK SURGERY     1989 and1994   CYSTOSCOPY/RETROGRADE/URETEROSCOPY/STONE EXTRACTION WITH BASKET Left 05/01/2012   Procedure: CYSTOSCOPY/LEFT RETROGRADE PYELOGRAM/LEFT URETEROSCOPY/STONE EXTRACTION WITH BASKET;  Surgeon: Kathi Ludwig, MD;  Location: [facility];  Service: Urology;  Laterality: Left;      STONE BASKET  EXTRACTION POSSIBLE JJ STENT PLACEMENT  C-ARM    EXTRACORPOREAL SHOCK WAVE LITHOTRIPSY Left 08/14/2018   Procedure: EXTRACORPOREAL SHOCK WAVE LITHOTRIPSY (ESWL);  Surgeon: Sebastian Ache, MD;  Location: [facility];  Service: Urology;  Laterality: Left;   HOLMIUM LASER APPLICATION Left 05/01/2012   Procedure: HOLMIUM LASER APPLICATION;  Surgeon: Kathi Ludwig, MD;  Location: [facility];  Service: Urology;  Laterality: Left;  FRAGMENTATION    LEFT HEART CATH AND CORONARY ANGIOGRAPHY N/A 11/23/2022   Procedure: LEFT HEART CATH AND CORONARY ANGIOGRAPHY;  Surgeon: Tonny Bollman, MD;  Location: [facility];  Service: Cardiovascular;  Laterality: N/A;   LUMBAR LAMINECTOMY  1994, 1989   L4-5, S1   STONE EXTRACTION WITH BASKET  2001    Family History: Family History  Family history unknown: Yes    Social History: Social History   Socioeconomic History   Marital status: Married    Spouse name: Not on file   Number of children: Not on file   Years of education: Not on file   Highest education level: Not on file  Occupational History   Not on file  Tobacco Use   Smoking status: Never   Smokeless tobacco: Never  Vaping Use   Vaping status: Never Used  Substance and Sexual Activity   Alcohol use: Yes    Comment: ocassional   Drug use: No   Sexual activity: Not on file    Comment: did not ask  Other Topics Concern   Not on file  Social History Narrative   Not on file   Social Determinants of Health   Financial Resource Strain: Not on file  Food Insecurity: No Food Insecurity (11/23/2022)   Hunger Vital Sign    Worried About Running Out of Food in the Last Year: Never true    Ran Out of Food in the Last Year: Never true  Transportation Needs: No Transportation Needs (11/23/2022)   PRAPARE - Administrator, Civil Service (Medical): No    Lack of Transportation (Non-Medical): No  Physical Activity: Not on file  Stress: Not on file  Social Connections: Not  on file    Allergies:  No Known Allergies  Objective:    Vital Signs:   Temp:  [96.3 F (35.7 C)-99.1 F (37.3 C)] 99.1 F (37.3 C) (11/13 0400) Pulse Rate:  [51-109] 52 (11/13 0700) Resp:  [13-24] 15 (11/13 0700) BP: (91-154)/(50-96) 104/73 (11/13 0700) SpO2:  [95 %-100 %] 97 % (11/13 0700) FiO2 (%):  [40 %-100 %] 40 % (11/12 1713) Weight:  [81.2 kg-84 kg] 81.2 kg (11/13 0500)    Weight change: Filed Weights   11/23/22 1326 11/23/22 1700 11/24/22 0500  Weight: 84 kg 83.6 kg 81.2 kg    Intake/Output:   Intake/Output Summary (Last 24 hours) at 11/24/2022 0842 Last data filed at 11/24/2022 0700 Gross per 24 hour  Intake  1600.34 ml  Output 1625 ml  Net -24.66 ml      Physical Exam    General:  Well appearing. No resp difficulty HEENT: normal Neck: supple. JVP not elevated . Carotids 2+ bilat; no bruits. No lymphadenopathy or thyromegaly appreciated. Cor: PMI nondisplaced. Regular rate & rhythm. No rubs, gallops or murmurs. Lungs: clear Abdomen: soft, nontender, nondistended. No hepatosplenomegaly. No bruits or masses. Good bowel sounds. Extremities: no cyanosis, clubbing, rash, edema Neuro: alert & orientedx3, cranial nerves grossly intact. moves all 4 extremities w/o difficulty. Affect pleasant   Telemetry   Sinus brady, 52 bpm. No VT   EKG    SB 56 bpm,  QTc 459 ms   Labs   Basic Metabolic Panel: Recent Labs  Lab 11/23/22 1327 11/23/22 1412 11/23/22 1711 11/24/22 0005  NA 141 140  --  139  K 3.8 3.4*  --  4.3  CL 108  --   --  108  CO2 19*  --   --  23  GLUCOSE 197*  --   --  95  BUN 27*  --   --  20  CREATININE 1.39*  --   --  1.22  CALCIUM 8.8*  --   --  8.6*  MG  --   --  1.8 2.2  PHOS  --   --  2.8  --     Liver Function Tests: Recent Labs  Lab 11/23/22 1327  AST 49*  ALT 47*  ALKPHOS 56  BILITOT 0.7  PROT 6.1*  ALBUMIN 3.5   No results for input(s): "LIPASE", "AMYLASE" in the last 168 hours. No results for input(s):  "AMMONIA" in the last 168 hours.  CBC: Recent Labs  Lab 11/23/22 1327 11/23/22 1412 11/24/22 0249  WBC 8.6  --  8.3  NEUTROABS 6.4  --   --   HGB 13.4 12.6* 12.8*  HCT 41.0 37.0* 39.0  MCV 91.9  --  89.2  PLT 199  --  186    Cardiac Enzymes: No results for input(s): "CKTOTAL", "CKMB", "CKMBINDEX", "TROPONINI" in the last 168 hours.  BNP: BNP (last 3 results) Recent Labs    11/23/22 2159  BNP 147.0*    ProBNP (last 3 results) No results for input(s): "PROBNP" in the last 8760 hours.   CBG: Recent Labs  Lab 11/23/22 2015 11/23/22 2339 11/24/22 0328 11/24/22 0809  GLUCAP 118* 90 112* 115*    Coagulation Studies: Recent Labs    11/23/22 1327  LABPROT 14.4  INR 1.1     Imaging   DG Chest Port 1 View  Result Date: 11/23/2022 CLINICAL DATA:  Cardiac arrest.  Evaluate support apparatuses. EXAM: PORTABLE CHEST 1 VIEW COMPARISON:  None Available. FINDINGS: Endotracheal tube is 7.4 cm above the carina. Nasogastric tube extends into the abdomen but the tip is not clearly identified. Probable atelectasis at the left lung base. Otherwise, no focal airspace disease or pulmonary edema. Negative for a pneumothorax. Heart size is within normal limits. IMPRESSION: 1. Endotracheal tube is 7.4 cm above the carina. 2. Possible left basilar atelectasis. Otherwise, no focal lung disease. Electronically Signed   By: Richarda Overlie M.D.   On: 11/23/2022 17:13   CARDIAC CATHETERIZATION  Result Date: 11/23/2022 1.  Widely patent left dominant coronary arteries with minimal plaquing but no obstructive CAD 2.  Mild segmental LV dysfunction with akinesis of the basal inferior wall, LVEF estimated at 45 to 50% 3.  Normal LVEDP The patient appears to have had a  nonischemic event.  Further management per the primary cardiology team   CT Head Wo Contrast  Result Date: 11/23/2022 CLINICAL DATA:  Mental status change, unknown cause; Polytrauma, blunt arrest, fall, trauma EXAM: CT HEAD WITHOUT  CONTRAST CT CERVICAL SPINE WITHOUT CONTRAST TECHNIQUE: Multidetector CT imaging of the head and cervical spine was performed following the standard protocol without intravenous contrast. Multiplanar CT image reconstructions of the cervical spine were also generated. RADIATION DOSE REDUCTION: This exam was performed according to the departmental dose-optimization program which includes automated exposure control, adjustment of the mA and/or kV according to patient size and/or use of iterative reconstruction technique. COMPARISON:  None Available. FINDINGS: CT HEAD FINDINGS Brain: No evidence of acute infarction, hemorrhage, hydrocephalus, extra-axial collection or mass lesion/mass effect. Vascular: No hyperdense vessel or unexpected calcification. Skull: Normal. Negative for fracture or focal lesion. Sinuses/Orbits: No middle ear or mastoid effusion. Paranasal sinuses are notable polypoid mucosal thickening in the left maxillary sinus. Orbits are unremarkable. Other: There is layering fluid in the oropharynx CT CERVICAL SPINE FINDINGS Alignment: Grade 1 anterolisthesis of C4 on C5. Skull base and vertebrae: No acute fracture. No primary bone lesion or focal pathologic process. Soft tissues and spinal canal: No prevertebral fluid or swelling. No visible canal hematoma. Disc levels:  No evidence of high-grade spinal stenosis. Upper chest: Negative. Other: None IMPRESSION: 1. No acute intracranial abnormality. 2. No acute fracture or traumatic subluxation of the cervical spine. Electronically Signed   By: Lorenza Cambridge M.D.   On: 11/23/2022 14:20   CT Cervical Spine Wo Contrast  Result Date: 11/23/2022 CLINICAL DATA:  Mental status change, unknown cause; Polytrauma, blunt arrest, fall, trauma EXAM: CT HEAD WITHOUT CONTRAST CT CERVICAL SPINE WITHOUT CONTRAST TECHNIQUE: Multidetector CT imaging of the head and cervical spine was performed following the standard protocol without intravenous contrast. Multiplanar CT  image reconstructions of the cervical spine were also generated. RADIATION DOSE REDUCTION: This exam was performed according to the departmental dose-optimization program which includes automated exposure control, adjustment of the mA and/or kV according to patient size and/or use of iterative reconstruction technique. COMPARISON:  None Available. FINDINGS: CT HEAD FINDINGS Brain: No evidence of acute infarction, hemorrhage, hydrocephalus, extra-axial collection or mass lesion/mass effect. Vascular: No hyperdense vessel or unexpected calcification. Skull: Normal. Negative for fracture or focal lesion. Sinuses/Orbits: No middle ear or mastoid effusion. Paranasal sinuses are notable polypoid mucosal thickening in the left maxillary sinus. Orbits are unremarkable. Other: There is layering fluid in the oropharynx CT CERVICAL SPINE FINDINGS Alignment: Grade 1 anterolisthesis of C4 on C5. Skull base and vertebrae: No acute fracture. No primary bone lesion or focal pathologic process. Soft tissues and spinal canal: No prevertebral fluid or swelling. No visible canal hematoma. Disc levels:  No evidence of high-grade spinal stenosis. Upper chest: Negative. Other: None IMPRESSION: 1. No acute intracranial abnormality. 2. No acute fracture or traumatic subluxation of the cervical spine. Electronically Signed   By: Lorenza Cambridge M.D.   On: 11/23/2022 14:20     Medications:     Current Medications:  Chlorhexidine Gluconate Cloth  6 each Topical Daily   famotidine  20 mg Per Tube BID   heparin injection (subcutaneous)  5,000 Units Subcutaneous Q8H   sodium chloride flush  10 mL Intracatheter Q12H   sodium chloride flush  3 mL Intravenous Q12H    Infusions:  amiodarone 30 mg/hr (11/24/22 0700)      Patient Profile   72 y/o male w/ no prior cardiac history, admitted  for OOH VT cardiac arrest.    Assessment/Plan   1. VT Cardiac Arrest - immediate bystander CPR + AED Shocks, Post ROCS EKG NSR w/ PVCs +  polymorphic VT - Initial K 3.8, Mg 1.8 - cath nonobstructive CAD  - cath LVG w/ mild segmental LV dysfunction w/ AK of the basal inferior wall. Prior chest CTs also w/ unusual calcified mass, felt to probably be sequelae of previous granulomatous infection, all raising concern for possible sarcoid - obtain formal echo  - plan cMRI today. If c/w sarcoid, will need PET to r/o active inflammation to guide therapies   - complete neurological recovery. No further VT on tele.  - Completing IV amio load. Transition to PO 400 mg bid  - keep K > 4.0 and Mg > 2.0 - EP consult for ICD prior to d/c for secondary prevention    2. Systolic Heart Failure - LVG w/ mildly reduced/low normal LVEF ~45-50% - uncertain baseline. ? Stunning from cardiac arrest  - obtain formal echo/ MRI  - volume stable on exam  - BP currently soft, limiting GDMT    Length of Stay: 1  Brittainy Simmons, PA-C  11/24/2022, 8:42 AM  Advanced Heart Failure Team Pager [phone] (M-F; 7a - 5p)  Please contact [facility] for night-coverage after hours (4p -7a ) and weekends on [email or web address]  Patient seen and examined with the above-signed Advanced Practice Provider and/or Housestaff. I personally reviewed laboratory data, imaging studies and relevant notes. I independently examined the patient and formulated the important aspects of the plan. I have edited the note to reflect any of my changes or salient points. I have personally discussed the plan with the patient and/or family.  73 y/o male with no significant PMHx admitted with resuscitated outpatient VT/VF arrest.   Cath with normal coronaries and inferobasilar WMA. Previous chest CT with stable calcified granulomas  Rhythm stable except for PVCs.   General:  Well appearing. No resp difficulty HEENT: normal Neck: supple. no JVD. Carotids 2+ bilat; no bruits. No lymphadenopathy or thryomegaly appreciated. Cor: PMI nondisplaced. Regular rate & rhythm. No rubs, gallops  or murmurs. Lungs: clear Abdomen: soft, nontender, nondistended. No hepatosplenomegaly. No bruits or masses. Good bowel sounds. Extremities: no cyanosis, clubbing, rash, edema Neuro: alert & orientedx3, cranial nerves grossly intact. moves all 4 extremities w/o difficulty. 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 Will get cMRI. Continue amio for now but would prefer mexilitene if able.   If MRI concerning for sarcoid will need PET to see if active and needs immunosuppression. If cMRI negative would likely benefit from genetic testing.   EP consulted for ICD.  Arvilla Meres, MD  7:42 PM

## 2022-11-24 NOTE — Consult Note (Addendum)
ELECTROPHYSIOLOGY CONSULT NOTE    Patient ID: Roy Sullivan MRN: [id number], DOB/AGE: 1950/05/22 72 y.o.  Admit date: 11/23/2022 Date of Consult: December 10, 2022  Primary Physician: Renford Dills, MD Primary Cardiologist: None  Electrophysiologist: New   Referring Provider: Dr. Gala Romney   Patient Profile: Roy Sullivan is a 72 y.o. male with a history of nephrolithiasis and calcified anterior mediastinal mass, but no prior cardiac history who is being seen today for the evaluation of OOH arrest and VT at the request of Dr. Gala Romney.  HPI:  Roy Sullivan is a 72 y.o. male with medical history as above.   PT admitted 11/12 after OOH arrest.  He was working out at [employer] when he had a witnessed arrest, immediate bystander application of AED which advised shocks. It is unclear if compressions were given, with differing histories. Post ROSC EKGs showed NSR w/ frequent PVCs. No ST segment elevation.   In the ED, he was moving spontaneously but not following commands. Intubated for airway protection. Had further runs of PMVT and placed on amio gtt. Initial K 3.8, Mg 1.8. Hs trop 13>>80. SCr 1.39 c/w previous baseline. AST 49>>ALT 47.   Taken for cath which showed patent left dominant coronaries with no obstructive CAD. LV gram with mild segmental LV dysfunction. LVEF estimated 45-50%.   Echo Dec 10, 2022 showed LVEF 50-55%, normal RV, mild MR.   cMRI done today to evaluate for cardiac sarcoidosis. , read pending.   EP asked to see and follow along for recommendations regarding amiodarone and ICD.   Today, he is feeling well, but overall overwhelmed, especially with the idea of not driving and needing an ICD. Denies chest pain, prior syncope, or family history of sudden cardiac death.    Labs Potassium4.3 10-Dec-2022 0005) Magnesium  2.2 December 10, 2022 0005) Creatinine, ser  1.22 December 10, 2022 0005) PLT  186 2022-12-10 0249) HGB  12.8* 12/10/2022 0249) WBC 8.3 2022/12/10 0249) Troponin I (High  Sensitivity)80* (11/12 1711).    Allergies, Medical, Surgical, Social, and Family Histories have been reviewed and are referenced here-in when relevant for medical decision making.     Physical Exam: Vitals:   December 10, 2022 0800 12/10/2022 0900 12/10/2022 1000 12/10/2022 1100  BP: 105/76 117/72 120/79 119/69  Pulse: (!) 56 (!) 50 (!) 51 (!) 50  Resp: 16 16 17    Temp: 98.1 F (36.7 C)     TempSrc: Oral     SpO2: 95% 97% 96% 97%  Weight:      Height:        GEN- NAD, A&O x 3, normal affect HEENT: Normocephalic, atraumatic Lungs- CTAB, Normal effort.  Heart- Regular rate and rhythm, No M/G/R.  GI- Soft, NT, ND.  Extremities- No clubbing, cyanosis, or edema   Radiology/Studies:   [facility] 11/23/2022  Patent left dominant coronaries with no obstructive CAD. LV gram with mild segmental LV dysfunction. LVEF estimated 45-50%.   Echo 10-Dec-2022 LVEF 50-55%, normal RV, mild MR.   cMRI done Dec 10, 2022 to evaluate for cardiac sarcoidosis. , read pending.   EKG: on arrival showed sinus tachycardia at 107 bpm with PVCs (personally reviewed)  TELEMETRY: Sinus brady/NSR 50-60s. On arrival showed frequent ectopy with couplets and triplets, but no sustained VT (personally reviewed)  Assessment/Plan:  OOH arrest PMVT on telemetry Has had completely neurologic recovery No obstructive CAD on cath.  Echo with normal EF s/p resuscitation (slightly down initially on LV gram) Continue amiodarone for now, may be able to wean completely off in the future pending his  course and additional work up.  cMRI done this am, results pending.  Volume status ok BP has been soft.  Reviewed [place] Guildelines of no driving x 6 months.   Meets criteria for ICD for secondary prevention prior to discharge.   For questions or updates, please contact [facility] Please consult [email or web address] for contact info under Cardiology/STEMI.  Dustin Flock, PA-C  11/24/2022 12:20 PM

## 2022-11-24 NOTE — Progress Notes (Signed)
Echocardiogram 2D Echocardiogram has been performed.  Roy Sullivan 11/24/2022, 9:26 AM

## 2022-11-24 NOTE — Plan of Care (Signed)

## 2022-11-24 NOTE — TOC Initial Note (Signed)
Transition of Care [facility]) - Initial/Assessment Note    Patient Details  Name: Roy Sullivan MRN: [id number] Date of Birth: Jan 02, 1951  Transition of Care [facility]) CM/SW Contact:    Elliot Cousin, RN Phone Number: [phone] 11/24/2022, 6:37 PM  Clinical Narrative:      CM spoke to pt and states he was independent pta.  Discussed daily weight and low sodium/heart healthy diet. States he not sure he has a scale.  Will continue to follow up for dc needs.                Expected Discharge Plan: Home/Self Care Barriers to Discharge: Continued Medical Work up   Patient Goals and CMS Choice Patient states their goals for this hospitalization and ongoing recovery are:: wants to get better          Expected Discharge Plan and Services   Discharge Planning Services: CM Consult                                          Prior Living Arrangements/Services   Lives with:: Spouse Patient language and need for interpreter reviewed:: Yes Do you feel safe going back to the place where you live?: Yes      Need for Family Participation in Patient Care: No (Comment) Care giver support system in place?: Yes (comment)   Criminal Activity/Legal Involvement Pertinent to Current Situation/Hospitalization: No - Comment as needed  Activities of Daily Living   ADL Screening (condition at time of admission) Independently performs ADLs?: Yes (appropriate for developmental age) Is the patient deaf or have difficulty hearing?: Yes Does the patient have difficulty seeing, even when wearing glasses/contacts?: Yes Does the patient have difficulty concentrating, remembering, or making decisions?: No  Permission Sought/Granted Permission sought to share information with : Case Manager, Family Supports, PCP Permission granted to share information with : Yes, Verbal Permission Granted  Share Information with NAME: Roy Sullivan     Permission granted to share info w Relationship:  wife  Permission granted to share info w Contact Information: [phone]  Emotional Assessment Appearance:: Appears stated age Attitude/Demeanor/Rapport: Engaged Affect (typically observed): Accepting Orientation: : Oriented to Self, Oriented to Place, Oriented to  Time, Oriented to Situation   Psych Involvement: No (comment)  Admission diagnosis:  Cardiac arrest [facility]) [I46.9] Patient Active Problem List   Diagnosis Date Noted   Cardiac arrest (HCC) 11/23/2022   PCP:  Renford Dills, MD Pharmacy:   [id number] [place], [place] - [address] [place] [address] Phone: [phone] Fax: [phone]     Social Determinants of Health (SDOH) Social History: SDOH Screenings   Food Insecurity: No Food Insecurity (11/23/2022)  Housing: Low Risk  (11/23/2022)  Transportation Needs: No Transportation Needs (11/23/2022)  Utilities: Not At Risk (11/23/2022)  Tobacco Use: Low Risk  (11/24/2022)   SDOH Interventions:     Readmission Risk Interventions     No data to display

## 2022-11-24 NOTE — Consult Note (Signed)
NAME:  Roy Sullivan, MRN:  [id number], DOB:  09/06/1950, LOS: 1 ADMISSION DATE:  11/23/2022, CONSULTATION DATE:  11/12 REFERRING MD:  Dr. Jeraldine Loots, CHIEF COMPLAINT:  cardiac arrest   History of Present Illness:  Patient is a 72 yo M w/ hx of recurrent kidney stones, HLD presentst to [facility] ED on 11/12 w/ cardiac arrest.  Per family patient is relatively healthy and works out regularly. On 11/12 patient was working out on [employer] television/film set and collapsed. Patient unresponsive. AED placed on patient and required 2 shocks. No compressions given per EMS. ROSC 5 minutes. Transferred to [facility] ED. Patient moving spontaneously not following commands. Hemodynamically stable. Given poor mental status patient intubated. CT head no acute abnormality. EKG sinus tachy 107 w/ no ST elevation; qtc 497. Having episodes of vtach. Started on amio. Cards consulted. Pccm consulted.  Wife and sister at bedside. Consented for coronary catheterization.   Pertinent  Medical History   Past Medical History:  Diagnosis Date   Dysrhythmia    pt unaware of diagnosis   History of kidney stones    Renal disorder    Significant Hospital Events: Including procedures, antibiotic start and stop dates in addition to other pertinent events   11/12 post arrest - coronary angiogram showed no flow limiting CAD  Interim History / Subjective:   Successfully extubated yesterday afternoon. Appears to be neurologically intact.  Complains only of chest pain from CPR.  Objective   Blood pressure 104/73, pulse (!) 52, temperature 99.1 F (37.3 C), temperature source Oral, resp. rate 15, height 5\' 9"  (1.753 m), weight 81.2 kg, SpO2 97%.    Vent Mode: PSV;CPAP FiO2 (%):  [40 %-100 %] 40 % Set Rate:  [18 bmp] 18 bmp Vt Set:  [550 mL-560 mL] 560 mL PEEP:  [5 cmH20] 5 cmH20 Pressure Support:  [8 cmH20] 8 cmH20 Plateau Pressure:  [13 cmH20-17 cmH20] 17 cmH20   Intake/Output Summary (Last 24 hours) at 11/24/2022 1324 Last data filed  at 11/24/2022 0700 Gross per 24 hour  Intake 1600.34 ml  Output 1625 ml  Net -24.66 ml   Filed Weights   11/23/22 1326 11/23/22 1700 11/24/22 0500  Weight: 84 kg 83.6 kg 81.2 kg   Examination: General:  elderly male, intubated HEENT: MM pink/moist; ETT in place Neuro: alert, following all commands, moving all 4 extremities, PERRL CV: s1s2, rrr,, ectopy no m/r/g PULM:  clear to auscultation, no wheezing GI: soft, bsx4 active  Extremities: warm/dry, no edema  Skin: no rashes or lesions   Ancillary Tests Personally Reviewed:   EKG today shows sinus bradycardia with Qtc 459 on amiodarone.  Echocardiogram shows LV dilatation with moderate LV dysfunction. RV normal with no significant valve abnormalities.   Assessment & Plan:   Status post cardiac arrest due to pulseless VT (polymorphic?) Acute respiratory failure due to above - now resolved.  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   Plan:  -Transition amiodarone to oral today with full dose load.  - Follow up CMR to further risk stratify, guide GDMT and point to diagnosis.  - Will check serum ACE level and CRP/ESR, although these are non-specific. - Likely transfer to telemetry. Progressive ambulation.  - Treat MSK pain with acetaminophen PRN.   Best Practice (right click and "Reselect all SmartList Selections" daily)   Diet/type: Heart healthy diet DVT prophylaxis: lovenox.  GI prophylaxis: H2B Lines: N/A Foley:  N/A Code Status:  full code Last date of multidisciplinary goals of care discussion [11/12 family updated at bedside]  Lynnell Catalan, MD [facility] ICU Physician [facility]  Pager: [phone] Or Epic Secure Chat After hours: [phone].  11/24/2022, 9:06 AM

## 2022-11-25 DIAGNOSIS — I469 Cardiac arrest, cause unspecified: Secondary | ICD-10-CM | POA: Diagnosis not present

## 2022-11-25 LAB — CBC
HCT: 40.5 % (ref 39.0–52.0)
Hemoglobin: 13.5 g/dL (ref 13.0–17.0)
MCH: 30.3 pg (ref 26.0–34.0)
MCHC: 33.3 g/dL (ref 30.0–36.0)
MCV: 90.8 fL (ref 80.0–100.0)
Platelets: 163 10*3/uL (ref 150–400)
RBC: 4.46 MIL/uL (ref 4.22–5.81)
RDW: 12.9 % (ref 11.5–15.5)
WBC: 7 10*3/uL (ref 4.0–10.5)
nRBC: 0 % (ref 0.0–0.2)

## 2022-11-25 LAB — BASIC METABOLIC PANEL
Anion gap: 8 (ref 5–15)
BUN: 18 mg/dL (ref 8–23)
CO2: 25 mmol/L (ref 22–32)
Calcium: 8.6 mg/dL — ABNORMAL LOW (ref 8.9–10.3)
Chloride: 106 mmol/L (ref 98–111)
Creatinine, Ser: 1.11 mg/dL (ref 0.61–1.24)
GFR, Estimated: >60 mL/min (ref >60)
Glucose, Bld: 94 mg/dL (ref 70–99)
Potassium: 3.9 mmol/L (ref 3.5–5.1)
Sodium: 139 mmol/L (ref 135–145)

## 2022-11-25 LAB — GLUCOSE, CAPILLARY
Glucose-Capillary: 111 mg/dL — ABNORMAL HIGH (ref 70–99)
Glucose-Capillary: 114 mg/dL — ABNORMAL HIGH (ref 70–99)
Glucose-Capillary: 80 mg/dL (ref 70–99)
Glucose-Capillary: 82 mg/dL (ref 70–99)
Glucose-Capillary: 85 mg/dL (ref 70–99)
Glucose-Capillary: 95 mg/dL (ref 70–99)
Glucose-Capillary: 97 mg/dL (ref 70–99)

## 2022-11-25 LAB — SURGICAL PCR SCREEN
MRSA, PCR: NEGATIVE
Staphylococcus aureus: NEGATIVE

## 2022-11-25 LAB — ANGIOTENSIN CONVERTING ENZYME: Angiotensin-Converting Enzyme: 32 U/L (ref 14–82)

## 2022-11-25 LAB — MAGNESIUM: Magnesium: 2 mg/dL (ref 1.7–2.4)

## 2022-11-25 MED ORDER — POLYETHYLENE GLYCOL 3350 17 G PO PACK: 17.0000 g | PACK | Freq: Every day | ORAL | Status: DC | PRN

## 2022-11-25 MED ORDER — SODIUM CHLORIDE 0.9 % IV SOLN: INTRAVENOUS | Status: DC

## 2022-11-25 MED ORDER — CHLORHEXIDINE GLUCONATE 4 % EX SOLN
60.0000 mL | Freq: Once | CUTANEOUS | Status: AC
  Administered 2022-11-25: 4 via TOPICAL
  Filled 2022-11-25: qty 60

## 2022-11-25 NOTE — Progress Notes (Addendum)
Advanced Heart Failure Rounding Note  PCP-Cardiologist: None   Subjective:    Rhythm stable, no further VT. On PO amio  K 3.9 Mg 2.0   Echo EF 50-55%, RV mildly reduced. No LVH    cMRI + thinning near-transmural LGE in basal-mid inferolateral wall, elevated ECV 34%, LVEF 50%, RVEF 43%, known anterior mediastinal mass. Findings suggestive of sarcoid.   Feels ok. Went for walk this morning. Denies dyspnea. C/w mild chest wall pain from chest compressions. No other complaints.   Objective:   Weight Range: 81.1 kg Body mass index is 26.4 kg/m.   Vital Signs:   Temp:  [97.9 F (36.6 C)-98.6 F (37 C)] 98.5 F (36.9 C) (11/14 0800) Pulse Rate:  [44-63] 56 (11/14 0700) Resp:  [10-17] 10 (11/14 0400) BP: (105-135)/(47-79) 124/69 (11/14 0700) SpO2:  [92 %-98 %] 95 % (11/14 0700) Weight:  [81.1 kg] 81.1 kg (11/14 0600) Last BM Date :  (PTA)  Weight change: Filed Weights   11/23/22 1700 11/24/22 0500 11/25/22 0600  Weight: 83.6 kg 81.2 kg 81.1 kg    Intake/Output:   Intake/Output Summary (Last 24 hours) at 11/25/2022 0858 Last data filed at 11/24/2022 1000 Gross per 24 hour  Intake 34.9 ml  Output --  Net 34.9 ml      Physical Exam    General:  Well appearing. No resp difficulty HEENT: Normal Neck: Supple. JVP not elevated . Carotids 2+ bilat; no bruits. No lymphadenopathy or thyromegaly appreciated. Cor: PMI nondisplaced. Regular rate & rhythm. No rubs, gallops or murmurs. Lungs: Clear Abdomen: Soft, nontender, nondistended. No hepatosplenomegaly. No bruits or masses. Good bowel sounds. Extremities: No cyanosis, clubbing, rash, edema Neuro: Alert & orientedx3, cranial nerves grossly intact. moves all 4 extremities w/o difficulty. Affect pleasant   Telemetry   SB 51 bpm. No further VT   EKG    N/A   Labs    CBC Recent Labs    11/23/22 1327 11/23/22 1412 11/24/22 0249 11/25/22 0408  WBC 8.6  --  8.3 7.0  NEUTROABS 6.4  --   --   --   HGB  13.4   < > 12.8* 13.5  HCT 41.0   < > 39.0 40.5  MCV 91.9  --  89.2 90.8  PLT 199  --  186 163   < > = values in this interval not displayed.   Basic Metabolic Panel Recent Labs    16/10/96 1711 11/24/22 0005 11/25/22 0408  NA  --  139 139  K  --  4.3 3.9  CL  --  108 106  CO2  --  23 25  GLUCOSE  --  95 94  BUN  --  20 18  CREATININE  --  1.22 1.11  CALCIUM  --  8.6* 8.6*  MG 1.8 2.2 2.0  PHOS 2.8  --   --    Liver Function Tests Recent Labs    11/23/22 1327  AST 49*  ALT 47*  ALKPHOS 56  BILITOT 0.7  PROT 6.1*  ALBUMIN 3.5   No results for input(s): "LIPASE", "AMYLASE" in the last 72 hours. Cardiac Enzymes No results for input(s): "CKTOTAL", "CKMB", "CKMBINDEX", "TROPONINI" in the last 72 hours.  BNP: BNP (last 3 results) Recent Labs    11/23/22 2159  BNP 147.0*    ProBNP (last 3 results) No results for input(s): "PROBNP" in the last 8760 hours.   D-Dimer No results for input(s): "DDIMER" in the last 72 hours. Hemoglobin  A1C Recent Labs    11/23/22 1327  HGBA1C 5.5   Fasting Lipid Panel Recent Labs    11/23/22 1327 11/24/22 0005  CHOL 153  --   HDL 63  --   LDLCALC 81  --   TRIG 45 29  CHOLHDL 2.4  --    Thyroid Function Tests No results for input(s): "TSH", "T4TOTAL", "T3FREE", "THYROIDAB" in the last 72 hours.  Invalid input(s): "FREET3"  Other results:   Imaging    MR CARDIAC VELOCITY FLOW MAP  Result Date: 11/24/2022 CLINICAL DATA:  VT, assess for cardiac sarcoidosis. EXAM: CARDIAC MRI TECHNIQUE: The patient was scanned on a 1.5 Tesla GE magnet. A dedicated cardiac coil was used. Functional imaging was done using Fiesta sequences. 2,3, and 4 chamber views were done to assess for RWMA's. Modified Simpson's rule using a short axis stack was used to calculate an ejection fraction on a dedicated work [employer] officer, [employer]. The patient received 10 cc of Gadavist. After 10 minutes inversion recovery sequences were used to  assess for infiltration and scar tissue. FINDINGS: Limited images of the lung fields showed mild bibasilar atelectasis. There was a 2 cm x 2 cm anterior mediastinal mass, located anterior to the ascending aorta. Mildly dilated left ventricle with normal wall thickness. Basal inferolateral akinesis and thinning, LVEF 50%. Mildly dilated right ventricle, RV EF 43%. Mild biatrial enlargement. Trileaflet aortic valve, no significant regurgitation or stenosis. Visually, mitral regurgitation looks mild. On delayed enhancement imaging, there was near-transmural late gadolinium enhancement (LGE) in basal to mid inferolateral wall. MEASUREMENTS: MEASUREMENTS LVEDV 229 mL LVEDVi 115 mL/m2 LVSV 115 mL LVEF 50% RVEDV 251 mL RVEDVi 126 mL/m2 RVSV 109 mL RVEF 43% Aortic forward volume 125 mL Aortic regurgitant fraction 0% T1 1104, ECV 34% IMPRESSION: 1.  Anterior mediastinal mass noted as above.  Reported on prior CT. 2.  Mildly dilated LV with basal inferolateral akinesis, EF 50%. 3.  Mildly dilated RV with EF 43%. 4. Thinning with near-transmural LGE in the basal to mid inferolateral wall. This is suggestive of prior MI in circumflex territory. However, in the absence of significant coronary disease, it is possible to see transmural/near-transmural LGE with cardiac sarcoidosis. 5. Extracellular volume percentage 34%, this is elevated suggesting increased myocardial fibrotic content. Roy Sullivan Electronically Signed   By: Marca Ancona M.D.   On: 11/24/2022 19:48   MR CARDIAC VELOCITY FLOW MAP  Result Date: 11/24/2022 CLINICAL DATA:  VT, assess for cardiac sarcoidosis. EXAM: CARDIAC MRI TECHNIQUE: The patient was scanned on a 1.5 Tesla GE magnet. A dedicated cardiac coil was used. Functional imaging was done using Fiesta sequences. 2,3, and 4 chamber views were done to assess for RWMA's. Modified Simpson's rule using a short axis stack was used to calculate an ejection fraction on a dedicated work Chief Technology Officer. The patient received 10 cc of Gadavist. After 10 minutes inversion recovery sequences were used to assess for infiltration and scar tissue. FINDINGS: Limited images of the lung fields showed mild bibasilar atelectasis. There was a 2 cm x 2 cm anterior mediastinal mass, located anterior to the ascending aorta. Mildly dilated left ventricle with normal wall thickness. Basal inferolateral akinesis and thinning, LVEF 50%. Mildly dilated right ventricle, RV EF 43%. Mild biatrial enlargement. Trileaflet aortic valve, no significant regurgitation or stenosis. Visually, mitral regurgitation looks mild. On delayed enhancement imaging, there was near-transmural late gadolinium enhancement (LGE) in basal to mid inferolateral wall. MEASUREMENTS: MEASUREMENTS LVEDV 229 mL LVEDVi 115 mL/m2  LVSV 115 mL LVEF 50% RVEDV 251 mL RVEDVi 126 mL/m2 RVSV 109 mL RVEF 43% Aortic forward volume 125 mL Aortic regurgitant fraction 0% T1 1104, ECV 34% IMPRESSION: 1.  Anterior mediastinal mass noted as above.  Reported on prior CT. 2.  Mildly dilated LV with basal inferolateral akinesis, EF 50%. 3.  Mildly dilated RV with EF 43%. 4. Thinning with near-transmural LGE in the basal to mid inferolateral wall. This is suggestive of prior MI in circumflex territory. However, in the absence of significant coronary disease, it is possible to see transmural/near-transmural LGE with cardiac sarcoidosis. 5. Extracellular volume percentage 34%, this is elevated suggesting increased myocardial fibrotic content. Roy Sullivan Electronically Signed   By: Marca Ancona M.D.   On: 11/24/2022 19:48   MR CARDIAC MORPHOLOGY W WO CONTRAST  Result Date: 11/24/2022 CLINICAL DATA:  VT, assess for cardiac sarcoidosis. EXAM: CARDIAC MRI TECHNIQUE: The patient was scanned on a 1.5 Tesla GE magnet. A dedicated cardiac coil was used. Functional imaging was done using Fiesta sequences. 2,3, and 4 chamber views were done to assess for RWMA's. Modified Simpson's  rule using a short axis stack was used to calculate an ejection fraction on a dedicated work [employer] officer, [employer]. The patient received 10 cc of Gadavist. After 10 minutes inversion recovery sequences were used to assess for infiltration and scar tissue. FINDINGS: Limited images of the lung fields showed mild bibasilar atelectasis. There was a 2 cm x 2 cm anterior mediastinal mass, located anterior to the ascending aorta. Mildly dilated left ventricle with normal wall thickness. Basal inferolateral akinesis and thinning, LVEF 50%. Mildly dilated right ventricle, RV EF 43%. Mild biatrial enlargement. Trileaflet aortic valve, no significant regurgitation or stenosis. Visually, mitral regurgitation looks mild. On delayed enhancement imaging, there was near-transmural late gadolinium enhancement (LGE) in basal to mid inferolateral wall. MEASUREMENTS: MEASUREMENTS LVEDV 229 mL LVEDVi 115 mL/m2 LVSV 115 mL LVEF 50% RVEDV 251 mL RVEDVi 126 mL/m2 RVSV 109 mL RVEF 43% Aortic forward volume 125 mL Aortic regurgitant fraction 0% T1 1104, ECV 34% IMPRESSION: 1.  Anterior mediastinal mass noted as above.  Reported on prior CT. 2.  Mildly dilated LV with basal inferolateral akinesis, EF 50%. 3.  Mildly dilated RV with EF 43%. 4. Thinning with near-transmural LGE in the basal to mid inferolateral wall. This is suggestive of prior MI in circumflex territory. However, in the absence of significant coronary disease, it is possible to see transmural/near-transmural LGE with cardiac sarcoidosis. 5. Extracellular volume percentage 34%, this is elevated suggesting increased myocardial fibrotic content. Roy Sullivan Electronically Signed   By: Marca Ancona M.D.   On: 11/24/2022 19:47   ECHOCARDIOGRAM COMPLETE  Result Date: 11/24/2022    ECHOCARDIOGRAM REPORT   Patient Name:   Roy Sullivan Date of Exam: 11/24/2022 Medical Rec #:  [id number]         Height:       69.0 in Accession #:    [id number]        Weight:        179.0 lb Date of Birth:  07/11/1950         BSA:          1.971 m Patient Age:    72 years          BP:           104/73 mmHg Patient Gender: M                 HR:  50 bpm. Exam Location:  Inpatient Procedure: 2D Echo, 3D Echo, Cardiac Doppler, Color Doppler and Strain Analysis Indications:    Cardiac Arrest  History:        Patient has no prior history of Echocardiogram examinations.                 Arrythmias:Bradycardia.  Sonographer:    Karma Ganja Referring Phys: [phone] MIHAI CROITORU  Sonographer Comments: Global longitudinal strain was attempted. IMPRESSIONS  1. Left ventricular ejection fraction, by estimation, is 50 to 55%. The left ventricle has low normal function. The left ventricle demonstrates regional wall motion abnormalities (see scoring diagram/findings for description). The left ventricular internal cavity size was mildly dilated. Left ventricular diastolic parameters are indeterminate.  2. Right ventricular systolic function is normal. The right ventricular size is mildly enlarged. There is normal pulmonary artery systolic pressure. The estimated right ventricular systolic pressure is 32.4 mmHg.  3. The mitral valve is grossly normal. Mild mitral valve regurgitation. No evidence of mitral stenosis.  4. The aortic valve is tricuspid. Aortic valve regurgitation is trivial. No aortic stenosis is present.  5. The inferior vena cava is normal in size with greater than 50% respiratory variability, suggesting right atrial pressure of 3 mmHg. FINDINGS  Left Ventricle: Left ventricular ejection fraction, by estimation, is 50 to 55%. The left ventricle has low normal function. The left ventricle demonstrates regional wall motion abnormalities. The left ventricular internal cavity size was mildly dilated. There is no left ventricular hypertrophy. Left ventricular diastolic parameters are indeterminate.  LV Wall Scoring: The posterior wall is hypokinetic. Right Ventricle: The right ventricular size is  mildly enlarged. No increase in right ventricular wall thickness. Right ventricular systolic function is normal. There is normal pulmonary artery systolic pressure. The tricuspid regurgitant velocity is 2.71  m/s, and with an assumed right atrial pressure of 3 mmHg, the estimated right ventricular systolic pressure is 32.4 mmHg. Left Atrium: Left atrial size was normal in size. Right Atrium: Right atrial size was normal in size. Pericardium: There is no evidence of pericardial effusion. Mitral Valve: The mitral valve is grossly normal. Mild mitral valve regurgitation. No evidence of mitral valve stenosis. Tricuspid Valve: The tricuspid valve is grossly normal. Tricuspid valve regurgitation is mild . No evidence of tricuspid stenosis. Aortic Valve: The aortic valve is tricuspid. Aortic valve regurgitation is trivial. No aortic stenosis is present. Aortic valve mean gradient measures 5.0 mmHg. Aortic valve peak gradient measures 9.1 mmHg. Aortic valve area, by VTI measures 2.69 cm. Pulmonic Valve: The pulmonic valve was grossly normal. Pulmonic valve regurgitation is not visualized. No evidence of pulmonic stenosis. Aorta: The aortic root and ascending aorta are structurally normal, with no evidence of dilitation. Venous: The inferior vena cava is normal in size with greater than 50% respiratory variability, suggesting right atrial pressure of 3 mmHg. IAS/Shunts: The atrial septum is grossly normal.  LEFT VENTRICLE PLAX 2D LVIDd:         5.90 cm      Diastology LVIDs:         4.60 cm      LV e' medial:    7.62 cm/s LV PW:         0.90 cm      LV E/e' medial:  12.4 LV IVS:        0.90 cm      LV e' lateral:   10.90 cm/s LVOT diam:     2.10 cm      LV  E/e' lateral: 8.6 LV SV:         88 LV SV Index:   45 LVOT Area:     3.46 cm                              3D Volume EF: LV Volumes (MOD)            3D EF:        62 % LV vol d, MOD A2C: 158.0 ml LV EDV:       156 ml LV vol d, MOD A4C: 154.5 ml LV ESV:       59 ml LV vol  s, MOD A2C: 67.3 ml  LV SV:        97 ml LV vol s, MOD A4C: 61.0 ml LV SV MOD A2C:     90.7 ml LV SV MOD A4C:     154.5 ml LV SV MOD BP:      93.6 ml RIGHT VENTRICLE             IVC RV Basal diam:  4.90 cm     IVC diam: 1.50 cm RV S prime:     16.00 cm/s TAPSE (M-mode): 2.9 cm LEFT ATRIUM            Index        RIGHT ATRIUM           Index LA diam:      3.90 cm  1.98 cm/m   RA Area:     21.70 cm LA Vol (A2C): 156.0 ml 79.15 ml/m  RA Volume:   63.45 ml  32.19 ml/m LA Vol (A4C): 41.1 ml  20.85 ml/m  AORTIC VALVE AV Area (Vmax):    2.47 cm AV Area (Vmean):   2.42 cm AV Area (VTI):     2.69 cm AV Vmax:           150.50 cm/s AV Vmean:          100.250 cm/s AV VTI:            0.326 m AV Peak Grad:      9.1 mmHg AV Mean Grad:      5.0 mmHg LVOT Vmax:         107.35 cm/s LVOT Vmean:        70.000 cm/s LVOT VTI:          0.254 m LVOT/AV VTI ratio: 0.78  AORTA Ao Root diam: 3.60 cm Ao Asc diam:  3.60 cm MITRAL VALVE               TRICUSPID VALVE MV Area (PHT): 3.32 cm    TR Peak grad:   29.4 mmHg MV Decel Time: 229 msec    TR Vmax:        271.00 cm/s MV E velocity: 94.05 cm/s MV A velocity: 87.25 cm/s  SHUNTS MV E/A ratio:  1.08        Systemic VTI:  0.25 m                            Systemic Diam: 2.10 cm Lennie Odor MD Electronically signed by Lennie Odor MD Signature Date/Time: 11/24/2022/10:52:39 AM    Final      Medications:     Scheduled Medications:  amiodarone  400 mg Oral BID   Chlorhexidine Gluconate Cloth  6 each Topical Daily  mupirocin ointment  1 Application Nasal BID   sodium chloride flush  10 mL Intracatheter Q12H   sodium chloride flush  3 mL Intravenous Q12H    Infusions:   PRN Medications: acetaminophen, ondansetron (ZOFRAN) IV, polyethylene glycol, sodium chloride flush    Patient Profile   72 y/o male w/ no prior cardiac history, admitted for OOH VT cardiac arrest.    Assessment/Plan   1. 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 Prior chest CTs also w/ unusual calcified mass, felt to probably be sequelae of previous granulomatous infection, all raising concern for possible sarcoid - Echo EF 50-55%, RV mildly reduced. No LVH   - cMRI also suggestive of sarcoid w/ + thinning near-transmural LGE in basal-mid inferolateral wall, elevated ECV 34%, LVEF 50%, RVEF 43%, known anterior mediastinal mass.  - Plan PET Scan to check for active inflammation. If active, will need to start on sarcoid treatment protocol   - complete neurological recovery. No further VT on tele.  - Continue PO 400 mg bid per EP  - keep K > 4.0 and Mg > 2.0 - Plan ICD prior to d/c for secondary prevention. Appreciate EP's assistance        Length of Stay: 2  Robbie Lis, PA-C  11/25/2022, 8:58 AM  Advanced Heart Failure Team Pager [phone] (M-F; 7a - 5p)  Please contact [facility] for night-coverage after hours (5p -7a ) and weekends on [email or web address]  Patient seen and examined with the above-signed Advanced Practice Provider and/or Housestaff. I personally reviewed laboratory data, imaging studies and relevant notes. I independently examined the patient and formulated the important aspects of the plan. I have edited the note to reflect any of my changes or salient points. I have personally discussed the plan with the patient and/or family.  Remains on IV amio. No further VT. PVCs suppressed.   Denies CP or SOB.   General:  Well appearing. No resp difficulty HEENT: normal Neck: supple. no JVD. Carotids 2+ bilat; no bruits. No lymphadenopathy or thryomegaly appreciated. Cor: PMI nondisplaced. Regular rate & rhythm. No rubs, gallops or murmurs. Lungs: clear Abdomen: soft, nontender, nondistended. No hepatosplenomegaly. No bruits or masses. Good bowel  sounds. Extremities: no cyanosis, clubbing, rash, edema Neuro: alert & orientedx3, cranial nerves grossly intact. moves all 4 extremities w/o difficulty. Affect pleasant  cMRI reviewed. Has small area of transmural scar in the inferolateral wall EF 50%  Case d/w EP.  Plan dual chamber ICD tomorrow. Continue amio for now. Will get PET scan next week. If no active inflammation with switch amio to mexilitene and follow PVC burden on device. If PET + will need immunosuppression,   Arvilla Meres, MD  5:45 PM

## 2022-11-25 NOTE — Progress Notes (Signed)
  Patient Name: Roy Sullivan Date of Encounter: 11/25/2022  Primary Cardiologist: None Electrophysiologist: Dr. Jimmey Ralph  Interval Summary   The patient is doing well today.  At this time, the patient denies chest pain, shortness of breath, or any new concerns.  Has many questions surrounding diagnosis of possible  Vital Signs    Vitals:   11/25/22 0400 11/25/22 0500 11/25/22 0600 11/25/22 0700  BP: (!) 110/57 111/61 112/67 124/69  Pulse: (!) 49 (!) 49 (!) 44 (!) 56  Resp: 10     Temp: 98.6 F (37 C)     TempSrc: Oral     SpO2: 97% 94% 96% 95%  Weight:   81.1 kg   Height:        Intake/Output Summary (Last 24 hours) at 11/25/2022 0814 Last data filed at 11/24/2022 1000 Gross per 24 hour  Intake 34.9 ml  Output --  Net 34.9 ml   Filed Weights   11/23/22 1700 11/24/22 0500 11/25/22 0600  Weight: 83.6 kg 81.2 kg 81.1 kg    Physical Exam    GEN- The patient is well appearing and abulatory, alert and oriented x 3 today.   Lungs- Clear to ausculation bilaterally, normal work of breathing Cardiac- Regular rate and rhythm, no murmurs, rubs or gallops GI- soft, NT, ND, + BS Extremities- no clubbing or cyanosis. No edema  Telemetry/Studies reviewed    SB/NSR 50-60s, occasional PVCs (personally reviewed)  Echo 11/24/22:  LVEF 50-55%, normal RV, RVSP 32.4 mmHg, mild MR, Trivial AI  cMRI 11/24/2022 1.  Anterior mediastinal mass noted as above.  Reported on prior CT. 2.  Mildly dilated LV with basal inferolateral akinesis, EF 50%. 3.  Mildly dilated RV with EF 43%. 4. Thinning with near-transmural LGE in the basal to mid inferolateral wall. This is suggestive of prior MI in circumflex territory. However, in the absence of significant coronary disease, it is possible to see transmural/near-transmural LGE with cardiac sarcoidosis.  Hospital Course    Roy Sullivan is a 71 y.o. male with a history of nephrolithiasis and calcified anterior mediastinal mass, but no  prior cardiac history who is being seen today for the evaluation of OOH arrest and VT at the request of Dr. Gala Romney   Assessment & Plan    OOH arrest PMVT on telemetry Has had completely neurologic recovery No obstructive CAD on cath.  Echo with normal EF s/p resuscitation (slightly down initially on LV gram) Continue amiodarone for now, may be able to wean completely off in the future pending his course. cMRI as below Volume status ok BP has been soft. Add BB as tolerated.   Reviewed [facility] Guildelines of no driving x 6 months.   Will plan Dual Chamber ICD tomorrow early am, 11/15.   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    For questions or updates, please contact [facility] Please consult [email or web address] for contact info under Cardiology/STEMI.  Signed, Graciella Freer, PA-C  11/25/2022, 8:14 AM

## 2022-11-25 NOTE — Plan of Care (Signed)

## 2022-11-26 ENCOUNTER — Other Ambulatory Visit: Payer: Self-pay

## 2022-11-26 ENCOUNTER — Encounter (HOSPITAL_COMMUNITY): Payer: Self-pay | Admitting: Cardiology

## 2022-11-26 ENCOUNTER — Inpatient Hospital Stay (HOSPITAL_COMMUNITY)
Admission: EM | Disposition: A | Discharge status: Home / Self Care | Payer: Self-pay | Source: Home / Self Care | Attending: Internal Medicine

## 2022-11-26 HISTORY — PX: ICD IMPLANT: EP1208

## 2022-11-26 LAB — CBC
HCT: 39.7 % (ref 39.0–52.0)
Hemoglobin: 13.3 g/dL (ref 13.0–17.0)
MCH: 30 pg (ref 26.0–34.0)
MCHC: 33.5 g/dL (ref 30.0–36.0)
MCV: 89.4 fL (ref 80.0–100.0)
Platelets: 175 10*3/uL (ref 150–400)
RBC: 4.44 MIL/uL (ref 4.22–5.81)
RDW: 12.7 % (ref 11.5–15.5)
WBC: 6.9 10*3/uL (ref 4.0–10.5)
nRBC: 0 % (ref 0.0–0.2)

## 2022-11-26 LAB — BASIC METABOLIC PANEL
Anion gap: 9 (ref 5–15)
BUN: 16 mg/dL (ref 8–23)
CO2: 23 mmol/L (ref 22–32)
Calcium: 8.7 mg/dL — ABNORMAL LOW (ref 8.9–10.3)
Chloride: 105 mmol/L (ref 98–111)
Creatinine, Ser: 1.37 mg/dL — ABNORMAL HIGH (ref 0.61–1.24)
GFR, Estimated: 55 mL/min — ABNORMAL LOW (ref >60)
Glucose, Bld: 101 mg/dL — ABNORMAL HIGH (ref 70–99)
Potassium: 3.9 mmol/L (ref 3.5–5.1)
Sodium: 137 mmol/L (ref 135–145)

## 2022-11-26 LAB — GLUCOSE, CAPILLARY
Glucose-Capillary: 84 mg/dL (ref 70–99)
Glucose-Capillary: 105 mg/dL — ABNORMAL HIGH (ref 70–99)
Glucose-Capillary: 111 mg/dL — ABNORMAL HIGH (ref 70–99)

## 2022-11-26 LAB — MAGNESIUM: Magnesium: 1.8 mg/dL (ref 1.7–2.4)

## 2022-11-26 SURGERY — ICD IMPLANT

## 2022-11-26 MED ORDER — LIDOCAINE HCL (PF) 1 % IJ SOLN
INTRAMUSCULAR | Status: DC | PRN
  Administered 2022-11-26: 60 mL

## 2022-11-26 MED ORDER — POTASSIUM CHLORIDE CRYS ER 20 MEQ PO TBCR
20.0000 meq | EXTENDED_RELEASE_TABLET | Freq: Once | ORAL | Status: AC
  Administered 2022-11-26: 20 meq via ORAL
  Filled 2022-11-26: qty 1

## 2022-11-26 MED ORDER — HEPARIN (PORCINE) IN NACL 1000-0.9 UT/500ML-% IV SOLN
INTRAVENOUS | Status: DC | PRN
  Administered 2022-11-26: 500 mL

## 2022-11-26 MED ORDER — FENTANYL CITRATE (PF) 100 MCG/2ML IJ SOLN
INTRAMUSCULAR | Status: AC
  Filled 2022-11-26: qty 2

## 2022-11-26 MED ORDER — MIDAZOLAM HCL 5 MG/5ML IJ SOLN
INTRAMUSCULAR | Status: AC
  Filled 2022-11-26: qty 5

## 2022-11-26 MED ORDER — SODIUM CHLORIDE 0.9 % IV SOLN
INTRAVENOUS | Status: AC
  Filled 2022-11-26: qty 2

## 2022-11-26 MED ORDER — MAGNESIUM SULFATE 2 GM/50ML IV SOLN
2.0000 g | Freq: Once | INTRAVENOUS | Status: AC
  Administered 2022-11-26: 2 g via INTRAVENOUS
  Filled 2022-11-26: qty 50

## 2022-11-26 MED ORDER — SODIUM CHLORIDE 0.9 % IV SOLN: INTRAVENOUS | Status: DC

## 2022-11-26 MED ORDER — CEFAZOLIN SODIUM-DEXTROSE 2-4 GM/100ML-% IV SOLN
2.0000 g | INTRAVENOUS | Status: AC
  Administered 2022-11-26: 2 g via INTRAVENOUS

## 2022-11-26 MED ORDER — CEFAZOLIN SODIUM-DEXTROSE 2-4 GM/100ML-% IV SOLN
INTRAVENOUS | Status: AC
  Filled 2022-11-26: qty 100

## 2022-11-26 MED ORDER — SODIUM CHLORIDE 0.9 % IV SOLN
80.0000 mg | INTRAVENOUS | Status: AC
  Administered 2022-11-26: 80 mg

## 2022-11-26 MED ORDER — MIDAZOLAM HCL 5 MG/5ML IJ SOLN
INTRAMUSCULAR | Status: DC | PRN
  Administered 2022-11-26: .5 mg via INTRAVENOUS
  Administered 2022-11-26 (×2): 1 mg via INTRAVENOUS
  Administered 2022-11-26: 2 mg via INTRAVENOUS
  Administered 2022-11-26 (×3): 1 mg via INTRAVENOUS

## 2022-11-26 MED ORDER — LIDOCAINE HCL 1 % IJ SOLN
INTRAMUSCULAR | Status: AC
  Filled 2022-11-26: qty 60

## 2022-11-26 MED ORDER — FENTANYL CITRATE (PF) 100 MCG/2ML IJ SOLN
INTRAMUSCULAR | Status: DC | PRN
  Administered 2022-11-26 (×5): 50 ug via INTRAVENOUS

## 2022-11-26 MED ORDER — CHLORHEXIDINE GLUCONATE 4 % EX SOLN
60.0000 mL | Freq: Once | CUTANEOUS | Status: AC
  Administered 2022-11-26: 4 via TOPICAL
  Filled 2022-11-26: qty 60

## 2022-11-26 MED FILL — Lidocaine HCl Local Preservative Free (PF) Inj 1%: INTRAMUSCULAR | Qty: 30 | Status: AC

## 2022-11-26 SURGICAL SUPPLY — 8 items
CABLE SURGICAL S-101-97-12 (CABLE) ×1 IMPLANT
ICD COBALT XT DR DDPA2D4 (ICD Generator) IMPLANT
LEAD CAPSURE NOVUS 5076-52CM (Lead) IMPLANT
LEAD SPRINT QUAT SEC 6935M-62 (Lead) IMPLANT
PAD DEFIB RADIO PHYSIO CONN (PAD) ×1 IMPLANT
SHEATH 7FR PRELUDE SNAP 13 (SHEATH) IMPLANT
SHEATH 9FR PRELUDE SNAP 13 (SHEATH) IMPLANT
TRAY PACEMAKER INSERTION (PACKS) ×1 IMPLANT

## 2022-11-26 NOTE — Discharge Instructions (Signed)
After Your ICD (Implantable Cardiac Defibrillator)   You have a [employer] ICD  ACTIVITY Do not lift your arm above shoulder height for 1 week after your procedure. After 7 days, you may progress as below.  You should remove your sling 24 hours after your procedure, unless otherwise instructed by your provider.     Friday December 03, 2022  Saturday December 04, 2022 Sunday December 05, 2022 Monday December 06, 2022   Do not lift, push, pull, or carry anything over 10 pounds with the affected arm until 6 weeks (Friday January 07, 2023 ) after your procedure.   You may drive AFTER your wound check, unless you have been told otherwise by your provider.   Ask your healthcare provider when you can go back to work   INCISION/Dressing If you are on a blood thinner such as Coumadin, Xarelto, Eliquis, Plavix, or Pradaxa please confirm with your provider when this should be resumed.   If large square, outer bandage is left in place, this can be removed after 24 hours from your procedure. Do not remove steri-strips or glue as below.   Monitor your defibrillator site for redness, swelling, and drainage. Call the device clinic at [phone] if you experience these symptoms or fever/chills.  If your incision is sealed with Steri-strips or staples, you may shower 7 days after your procedure or when told by your provider. Do not remove the steri-strips or let the shower hit directly on your site. You may wash around your site with soap and water.    If you were discharged in a sling, please do not wear this during the day more than 48 hours after your surgery unless otherwise instructed. This may increase the risk of stiffness and soreness in your shoulder.   Avoid lotions, ointments, or perfumes over your incision until it is well-healed.  You may use a hot tub or a pool AFTER your wound check appointment if the incision is completely closed.  Your ICD is designed to protect you from life  threatening heart rhythms. Because of this, you may receive a shock.   1 shock with no symptoms:  Call the office during business hours. 1 shock with symptoms (chest pain, chest pressure, dizziness, lightheadedness, shortness of breath, overall feeling unwell):  Call 911. If you experience 2 or more shocks in 24 hours:  Call 911. If you receive a shock, you should not drive for 6 months per the [place] DMV IF you receive appropriate therapy from your ICD.   ICD Alerts:  Some alerts are vibratory and others beep. These are NOT emergencies. Please call our office to let us know. If this occurs at night or on weekends, it can wait until the next business day. Send a remote transmission.  If your device is capable of reading fluid status (for heart failure), you will be offered monthly monitoring to review this with you.   DEVICE MANAGEMENT Remote monitoring is used to monitor your ICD from home. This monitoring is scheduled every 91 days by our office. It allows [place] to keep an eye on the functioning of your device to ensure it is working properly. You will routinely see your Electrophysiologist annually (more often if necessary).   You should receive your ID card for your new device in 4-8 weeks. Keep this card with you at all times once received. Consider wearing a medical alert bracelet or necklace.  Your ICD  may be MRI compatible. This will be discussed at your next  office visit/wound check.  You should avoid contact with strong electric or magnetic fields.   Do not use amateur (ham) radio equipment or electric (arc) welding torches. MP3 player headphones with magnets should not be used. Some devices are safe to use if held at least 12 inches (30 cm) from your defibrillator. These include power tools, lawn mowers, and speakers. If you are unsure if something is safe to use, ask your health care provider.  When using your cell phone, hold it to the ear that is on the opposite side from the  defibrillator. Do not leave your cell phone in a pocket over the defibrillator.  You may safely use electric blankets, heating pads, computers, and microwave ovens.  Call the office right away if: You have chest pain. You feel more than one shock. You feel more short of breath than you have felt before. You feel more light-headed than you have felt before. Your incision starts to open up.  This information is not intended to replace advice given to you by your health care provider. Make sure you discuss any questions you have with your health care provider.

## 2022-11-26 NOTE — Plan of Care (Signed)
  Problem: Education: Goal: Knowledge of General Education information will improve Description: Including pain rating scale, medication(s)/side effects and non-pharmacologic comfort measures Outcome: Progressing   Problem: Health Behavior/Discharge Planning: Goal: Ability to manage health-related needs will improve Outcome: Progressing   Problem: Clinical Measurements: Goal: Ability to maintain clinical measurements within normal limits will improve Outcome: Progressing Goal: Will remain free from infection Outcome: Progressing Goal: Diagnostic test results will improve Outcome: Progressing Goal: Respiratory complications will improve Outcome: Progressing Goal: Cardiovascular complication will be avoided Outcome: Progressing   Problem: Activity: Goal: Risk for activity intolerance will decrease Outcome: Progressing   Problem: Nutrition: Goal: Adequate nutrition will be maintained Outcome: Progressing   Problem: Coping: Goal: Level of anxiety will decrease Outcome: Progressing   Problem: Elimination: Goal: Will not experience complications related to bowel motility Outcome: Progressing Goal: Will not experience complications related to urinary retention Outcome: Progressing   Problem: Pain Management: Goal: General experience of comfort will improve Outcome: Progressing   Problem: Safety: Goal: Ability to remain free from injury will improve Outcome: Progressing   Problem: Skin Integrity: Goal: Risk for impaired skin integrity will decrease Outcome: Progressing   Problem: Education: Goal: Understanding of CV disease, CV risk reduction, and recovery process will improve Outcome: Progressing Goal: Individualized Educational Video(s) Outcome: Progressing   Problem: Activity: Goal: Ability to return to baseline activity level will improve Outcome: Progressing   Problem: Cardiovascular: Goal: Ability to achieve and maintain adequate cardiovascular perfusion  will improve Outcome: Progressing Goal: Vascular access site(s) Level 0-1 will be maintained Outcome: Progressing   Problem: Health Behavior/Discharge Planning: Goal: Ability to safely manage health-related needs after discharge will improve Outcome: Progressing   Problem: Education: Goal: Knowledge of cardiac device and self-care will improve Outcome: Progressing Goal: Ability to safely manage health related needs after discharge will improve Outcome: Progressing Goal: Individualized Educational Video(s) Outcome: Progressing   Problem: Cardiac: Goal: Ability to achieve and maintain adequate cardiopulmonary perfusion will improve Outcome: Progressing

## 2022-11-26 NOTE — Interval H&P Note (Signed)
History and Physical Interval Note:  11/26/2022 7:21 AM  Roy Sullivan  has presented today for surgery, with the diagnosis of cardiac arrest.  The various methods of treatment have been discussed with the patient and family. After consideration of risks, benefits and other options for treatment, the patient has consented to  Procedure(s): ICD IMPLANT (N/A) as a surgical intervention.  The patient's history has been reviewed, patient examined, no change in status, stable for surgery.  I have reviewed the patient's chart and labs.  Questions were answered to the patient's satisfaction.     Roy Sullivan

## 2022-11-26 NOTE — Progress Notes (Addendum)
PT Cancellation Note  Patient Details Name: Roy Sullivan MRN: [id number] DOB: 1950-07-25   Cancelled Treatment:    Reason Eval/Treat Not Completed: Patient at procedure or test/unavailable   1130- pt on bedrest til am  Dreyah Montrose B Coyle Stordahl 11/26/2022, 7:58 AM Merryl Hacker, PT Acute Rehabilitation Services Office: [phone]

## 2022-11-26 NOTE — Progress Notes (Signed)
  Patient Name: Roy Sullivan Date of Encounter: 11/26/2022  Primary Cardiologist: None Electrophysiologist: Dr. Jimmey Ralph  Interval Summary   The patient is doing well today.  At this time, the patient denies chest pain, shortness of breath, or any new concerns.  Vital Signs    Vitals:   11/26/22 0305 11/26/22 0420 11/26/22 0500 11/26/22 0600  BP: 119/68 130/64 127/79 129/74  Pulse: (!) 58 (!) 49 (!) 56 (!) 51  Resp:  12    Temp:  99 F (37.2 C)    TempSrc:  Oral    SpO2: 93% 93% 95% 95%  Weight:   80 kg   Height:        Intake/Output Summary (Last 24 hours) at 11/26/2022 0705 Last data filed at 11/26/2022 0600 Gross per 24 hour  Intake 555.43 ml  Output --  Net 555.43 ml   Filed Weights   11/24/22 0500 11/25/22 0600 11/26/22 0500  Weight: 81.2 kg 81.1 kg 80 kg    Physical Exam    GEN- The patient is well appearing, alert and oriented x 3 today.   Lungs- Clear to ausculation bilaterally, normal work of breathing Cardiac- Regular rate and rhythm, no murmurs, rubs or gallops GI- soft, NT, ND, + BS Extremities- no clubbing or cyanosis. No edema  Telemetry    SB/NSR 50-60s (personally reviewed)  Hospital Course    Roy Sullivan is a 72 y.o. male with a history of nephrolithiasis and calcified anterior mediastinal mass, but no prior cardiac history who is being seen today for the evaluation of OOH arrest and VT at the request of Dr. Gala Romney   Assessment & Plan    OOH arrest PMVT on telemetry Has had completely neurologic recovery No obstructive CAD on cath.  Echo with normal EF s/p resuscitation (slightly down initially on LV gram) Continue amiodarone for now, may be able to wean completely off in the future pending his course. cMRI as below Volume status ok BP has been soft. Add BB as tolerated.   Reviewed [place] Guildelines of no driving x 6 months.    Plan Dual Chamber ICD today. Explained risks, benefits, and alternatives to ICD implantation,  including but not limited to bleeding, infection, pneumothorax, pericardial effusion, lead dislodgement, heart attack, stroke, or death.  Pt verbalized understanding and agrees to proceed.         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   If NON-inflamed, wean off amio and replace with BB.    For questions or updates, please contact [facility] Please consult [email or web address] for contact info under Cardiology/STEMI.  Signed, Graciella Freer, PA-C  11/26/2022, 7:05 AM

## 2022-11-26 NOTE — Progress Notes (Addendum)
Advanced Heart Failure Rounding Note  PCP-Cardiologist: None   Subjective:    S/p single chamber ICD this am.   Seen post device implant. Feeling well. No dyspnea. Notes some chest discomfort from CPR.   Echo EF 50-55%, RV mildly reduced. No LVH    cMRI reviewed. Has small area of transmural scar in the inferolateral wall EF 50%  Objective:   Weight Range: 80 kg Body mass index is 26.05 kg/m.   Vital Signs:   Temp:  [98 F (36.7 C)-99 F (37.2 C)] 98.2 F (36.8 C) (11/15 1058) Pulse Rate:  [49-115] 56 (11/15 1025) Resp:  [9-21] 16 (11/15 1025) BP: (106-179)/(53-81) 161/53 (11/15 1025) SpO2:  [85 %-99 %] 94 % (11/15 1025) Weight:  [80 kg] 80 kg (11/15 0500) Last BM Date :  (pta)  Weight change: Filed Weights   11/24/22 0500 11/25/22 0600 11/26/22 0500  Weight: 81.2 kg 81.1 kg 80 kg    Intake/Output:   Intake/Output Summary (Last 24 hours) at 11/26/2022 1108 Last data filed at 11/26/2022 0600 Gross per 24 hour  Intake 552.43 ml  Output --  Net 552.43 ml      Physical Exam    General:  Well appearing. Sitting up in chair. HEENT: Normal Neck: Supple. JVP not elevated . Carotids 2+ bilat; no bruits.  Cor: PMI nondisplaced. Regular rate & rhythm. No rubs, gallops or murmurs. Dressing over ICD site. Lungs: Clear Abdomen: Soft, nontender, nondistended.  Extremities: No cyanosis, clubbing, rash, edema Neuro: Alert & orientedx3. moves all 4 extremities w/o difficulty. Affect pleasant   Telemetry   SB 50s  EKG    N/A   Labs    CBC Recent Labs    11/23/22 1327 11/23/22 1412 11/25/22 0408 11/26/22 0304  WBC 8.6   < > 7.0 6.9  NEUTROABS 6.4  --   --   --   HGB 13.4   < > 13.5 13.3  HCT 41.0   < > 40.5 39.7  MCV 91.9   < > 90.8 89.4  PLT 199   < > 163 175   < > = values in this interval not displayed.   Basic Metabolic Panel Recent Labs    16/10/96 1711 11/24/22 0005 11/25/22 0408 11/26/22 0304  NA  --    < > 139 137  K  --    < >  3.9 3.9  CL  --    < > 106 105  CO2  --    < > 25 23  GLUCOSE  --    < > 94 101*  BUN  --    < > 18 16  CREATININE  --    < > 1.11 1.37*  CALCIUM  --    < > 8.6* 8.7*  MG 1.8   < > 2.0 1.8  PHOS 2.8  --   --   --    < > = values in this interval not displayed.   Liver Function Tests Recent Labs    11/23/22 1327  AST 49*  ALT 47*  ALKPHOS 56  BILITOT 0.7  PROT 6.1*  ALBUMIN 3.5   No results for input(s): "LIPASE", "AMYLASE" in the last 72 hours. Cardiac Enzymes No results for input(s): "CKTOTAL", "CKMB", "CKMBINDEX", "TROPONINI" in the last 72 hours.  BNP: BNP (last 3 results) Recent Labs    11/23/22 2159  BNP 147.0*    ProBNP (last 3 results) No results for input(s): "PROBNP" in the last 8760  hours.   D-Dimer No results for input(s): "DDIMER" in the last 72 hours. Hemoglobin A1C Recent Labs    11/23/22 1327  HGBA1C 5.5   Fasting Lipid Panel Recent Labs    11/23/22 1327 11/24/22 0005  CHOL 153  --   HDL 63  --   LDLCALC 81  --   TRIG 45 29  CHOLHDL 2.4  --    Thyroid Function Tests No results for input(s): "TSH", "T4TOTAL", "T3FREE", "THYROIDAB" in the last 72 hours.  Invalid input(s): "FREET3"  Other results:   Imaging    EP PPM/ICD IMPLANT  Addendum Date: 11/26/2022    CONCLUSIONS:  1. Successful ICD implantation with a single coil ICD lead implanted for secondary prevention of sudden death.  2. Successful DFTs with a single 30J shock.  3. No early apparent complications. [name], MD Cardiac Electrophysiology  Result Date: 11/26/2022  CONCLUSIONS:  1. Successful ICD implantation with a single coil ICD lead implanted for secondary prevention of sudden death.  2. Successful DFTs with a single 30J shock.  3. No early apparent complications. [name], MD Cardiac Electrophysiology     Medications:     Scheduled Medications:  amiodarone  400 mg Oral BID   Chlorhexidine Gluconate Cloth  6 each Topical Daily   sodium chloride 0.9  % with gentamicin (GARAMYCIN) ADS Med       sodium chloride flush  10 mL Intracatheter Q12H   sodium chloride flush  3 mL Intravenous Q12H    Infusions:  ceFAZolin      PRN Medications: acetaminophen, ceFAZolin, ondansetron (ZOFRAN) IV, polyethylene glycol, sodium chloride 0.9 % with gentamicin (GARAMYCIN) ADS Med, sodium chloride flush    Patient Profile   72 y/o male w/ no prior cardiac history, admitted for OOH VT cardiac arrest.    Assessment/Plan   1. 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 Prior chest CTs also w/ unusual calcified mass, felt to probably be sequelae of previous granulomatous infection, all raising concern for possible sarcoid - Echo EF 50-55%, RV mildly reduced. No LVH   - cMRI: EF 50%,RVEF 43%, thinning with near-transmural LGE in basal to mid inferolateral wall suggestive of possible cardiac sarcoidosis - Plan PET CT within the next 1-2 weeks to check for active inflammation. If active, will need to start on sarcoid treatment protocol  - No further VT on telemetry  - Continue PO 400 mg bid per EP at least until PET completed - keep K > 4.0 and Mg > 2.0 - S/p ICD placement by Dr. Jimmey Ralph 11/26/22    Will transfer out of ICU.  Plan for discharge home tomorrow.   Length of Stay: 3  FINCH, LINDSAY N, PA-C  11/26/2022, 11:08 AM  Advanced Heart Failure Team Pager [phone] (M-F; 7a - 5p)  Please contact [facility] for night-coverage after hours (5p -7a ) and weekends on [email or web address]  Patient seen and examined with the above-signed Advanced Practice Provider and/or Housestaff. I personally reviewed laboratory data, imaging studies and relevant notes. I independently examined the patient and formulated the important aspects of the plan. I  have edited the note to reflect any of my changes or salient points. I have personally discussed the plan with the patient and/or family.  Remains on amio. Rhythm stable. Now s/p ICD implant. Feele well   General:  Well appearing. No resp difficulty HEENT: normal Neck: supple. no JVD. Carotids 2+ bilat; no bruits. No lymphadenopathy or thryomegaly appreciated. Cor: PMI nondisplaced. Regular rate & rhythm. No rubs, gallops or murmurs. Lungs: clear Abdomen: soft, nontender, nondistended. No hepatosplenomegaly. No bruits or masses. Good bowel sounds. Extremities: no cyanosis, clubbing, rash, edema LUE in sling Neuro: alert & orientedx3, cranial nerves grossly intact. moves all 4 extremities w/o difficulty. Affect pleasant  Rhythm stable. ICD now in place.   Can got to floor. Plan d/c tomorrow on po amio. Will need outpatient PET. No driving x 6 months.   Arvilla Meres, MD  2:07 PM

## 2022-11-26 NOTE — H&P (View-Only) (Signed)
  Patient Name: Roy Sullivan Date of Encounter: 11/26/2022  Primary Cardiologist: None Electrophysiologist: Dr. Jimmey Ralph  Interval Summary   The patient is doing well today.  At this time, the patient denies chest pain, shortness of breath, or any new concerns.  Vital Signs    Vitals:   11/26/22 0305 11/26/22 0420 11/26/22 0500 11/26/22 0600  BP: 119/68 130/64 127/79 129/74  Pulse: (!) 58 (!) 49 (!) 56 (!) 51  Resp:  12    Temp:  99 F (37.2 C)    TempSrc:  Oral    SpO2: 93% 93% 95% 95%  Weight:   80 kg   Height:        Intake/Output Summary (Last 24 hours) at 11/26/2022 0705 Last data filed at 11/26/2022 0600 Gross per 24 hour  Intake 555.43 ml  Output --  Net 555.43 ml   Filed Weights   11/24/22 0500 11/25/22 0600 11/26/22 0500  Weight: 81.2 kg 81.1 kg 80 kg    Physical Exam    GEN- The patient is well appearing, alert and oriented x 3 today.   Lungs- Clear to ausculation bilaterally, normal work of breathing Cardiac- Regular rate and rhythm, no murmurs, rubs or gallops GI- soft, NT, ND, + BS Extremities- no clubbing or cyanosis. No edema  Telemetry    SB/NSR 50-60s (personally reviewed)  Hospital Course    Roy Sullivan is a 72 y.o. male with a history of nephrolithiasis and calcified anterior mediastinal mass, but no prior cardiac history who is being seen today for the evaluation of OOH arrest and VT at the request of Dr. Gala Romney   Assessment & Plan    OOH arrest PMVT on telemetry Has had completely neurologic recovery No obstructive CAD on cath.  Echo with normal EF s/p resuscitation (slightly down initially on LV gram) Continue amiodarone for now, may be able to wean completely off in the future pending his course. cMRI as below Volume status ok BP has been soft. Add BB as tolerated.   Reviewed [place] Guildelines of no driving x 6 months.    Plan Dual Chamber ICD today. Explained risks, benefits, and alternatives to ICD implantation,  including but not limited to bleeding, infection, pneumothorax, pericardial effusion, lead dislodgement, heart attack, stroke, or death.  Pt verbalized understanding and agrees to proceed.         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   If NON-inflamed, wean off amio and replace with BB.    For questions or updates, please contact [facility] Please consult [email or web address] for contact info under Cardiology/STEMI.  Signed, Graciella Freer, PA-C  11/26/2022, 7:05 AM

## 2022-11-26 NOTE — Discharge Summary (Incomplete)
Advanced Heart Failure Team  Discharge Summary   Patient ID: Roy Sullivan MRN: [id number], DOB/AGE: 72/17/1952 72 y.o. Admit date: 11/23/2022 D/C date:     11/26/2022   Primary Discharge Diagnoses:  ***  Secondary Discharge Diagnoses:  [facility] [facility] Course:  Roy Sullivan is a 72 y/o male w/ no prior cardiac history u    Has h/o abnormal chest CTs performed in 2014 and 2018 showing unusual calcified mass, felt to probably be sequelae of previous granulomatous infection. Also w/ h/o nephrolithiasis.    Prior to yesterday's events, was in [place]. Active, working out at gym daily. Was at the gym yesterday, doing cardio workout and sustained witnessed cardiac arrest w/ immediate bystander CPR. AED advised shocks. Post ROSC EKGs showed NSR w/ frequent PVCs. No ST segment elevations. Also w/ burst of PMVT on tele monitor.    In the ED, he was moving spontaneously but not following commands. Intubated for airway protection. Had further runs of PMVT and placed on amio gtt. Initial K 3.8, Mg 1.8. Hs trop 13>>80. SCr 1.39 c/w previous baseline. AST 49>>ALT 47.    He was taken to cath lab which showed widely patent left dominant coronaries w/ minimal plaquing but no obstructive CAD. LVG showed mild segmental LV dysfunction w/ AK of the basal inferior wall, LVEF estimated 45-50%. LVEDP normal.  Formal echo and cMRI ordered and pending. AHF team consulted for further management.    He is now extubated. On amio gtt w/ orders to transition to PO today, 400 mg bid. Telemetry shows sinus brady, 52 bpm. No further VT. He is alert and oriented. Currently getting echo. Denies dyspnea. Daughter present at bedside.   Discharge Weight Range: *** Discharge Vitals: Blood pressure 120/76, pulse 64, temperature 98.2 F (36.8 C), temperature source Axillary, resp. rate 16, height 5\' 9"  (1.753 m), weight 80 kg, SpO2 95%.  Labs: Lab Results  Component Value Date   WBC 6.9 11/26/2022   HGB 13.3 11/26/2022    HCT 39.7 11/26/2022   MCV 89.4 11/26/2022   PLT 175 11/26/2022    Recent Labs  Lab 11/23/22 1327 11/23/22 1412 11/26/22 0304  NA 141   < > 137  K 3.8   < > 3.9  CL 108   < > 105  CO2 19*   < > 23  BUN 27*   < > 16  CREATININE 1.39*   < > 1.37*  CALCIUM 8.8*   < > 8.7*  PROT 6.1*  --   --   BILITOT 0.7  --   --   ALKPHOS 56  --   --   ALT 47*  --   --   AST 49*  --   --   GLUCOSE 197*   < > 101*   < > = values in this interval not displayed.   Lab Results  Component Value Date   CHOL 153 11/23/2022   HDL 63 11/23/2022   LDLCALC 81 11/23/2022   TRIG 29 11/24/2022   BNP (last 3 results) Recent Labs    11/23/22 2159  BNP 147.0*    ProBNP (last 3 results) No results for input(s): "PROBNP" in the last 8760 hours.   Diagnostic Studies/Procedures   EP PPM/ICD IMPLANT  Addendum Date: 11/26/2022    CONCLUSIONS:  1. Successful ICD implantation with a single coil ICD lead implanted for secondary prevention of sudden death.  2. Successful DFTs with a single 30J shock.  3. No early apparent complications.  [name], MD Cardiac Electrophysiology  Result Date: 11/26/2022  CONCLUSIONS:  1. Successful ICD implantation with a single coil ICD lead implanted for secondary prevention of sudden death.  2. Successful DFTs with a single 30J shock.  3. No early apparent complications. [name], MD Cardiac Electrophysiology    Discharge Medications   Allergies as of 11/26/2022   No Known Allergies   Med Rec must be completed prior to using this [facility]***       Disposition   The patient will be discharged in stable condition to home.   Follow-up Information     [facility] Follow up on 12/07/2022.   Specialty: Cardiology Why: Advanced Heart Failure Clinic 3:40 PM Entrance C, Free Valet Parking Please bring all medications to appointment. Contact information: [address] [place]  [address] [phone]                  Duration of Discharge Encounter: Greater than 35 minutes   Signed, [facility], Javelle Donigan N  11/26/2022, 3:30 PM

## 2022-11-27 ENCOUNTER — Inpatient Hospital Stay (HOSPITAL_COMMUNITY): Payer: Medicare HMO

## 2022-11-27 ENCOUNTER — Other Ambulatory Visit (HOSPITAL_COMMUNITY): Payer: Self-pay

## 2022-11-27 DIAGNOSIS — I472 Ventricular tachycardia, unspecified: Secondary | ICD-10-CM | POA: Insufficient documentation

## 2022-11-27 DIAGNOSIS — D8685 Sarcoid myocarditis: Secondary | ICD-10-CM | POA: Insufficient documentation

## 2022-11-27 LAB — BASIC METABOLIC PANEL
Anion gap: 8 (ref 5–15)
BUN: 23 mg/dL (ref 8–23)
CO2: 24 mmol/L (ref 22–32)
Calcium: 8.8 mg/dL — ABNORMAL LOW (ref 8.9–10.3)
Chloride: 106 mmol/L (ref 98–111)
Creatinine, Ser: 1.02 mg/dL (ref 0.61–1.24)
GFR, Estimated: >60 mL/min (ref >60)
Glucose, Bld: 109 mg/dL — ABNORMAL HIGH (ref 70–99)
Potassium: 4.1 mmol/L (ref 3.5–5.1)
Sodium: 138 mmol/L (ref 135–145)

## 2022-11-27 LAB — CBC
HCT: 40.5 % (ref 39.0–52.0)
Hemoglobin: 13.6 g/dL (ref 13.0–17.0)
MCH: 29.9 pg (ref 26.0–34.0)
MCHC: 33.6 g/dL (ref 30.0–36.0)
MCV: 89 fL (ref 80.0–100.0)
Platelets: 173 10*3/uL (ref 150–400)
RBC: 4.55 MIL/uL (ref 4.22–5.81)
RDW: 12.4 % (ref 11.5–15.5)
WBC: 7.4 10*3/uL (ref 4.0–10.5)
nRBC: 0 % (ref 0.0–0.2)

## 2022-11-27 LAB — GLUCOSE, CAPILLARY
Glucose-Capillary: 102 mg/dL — ABNORMAL HIGH (ref 70–99)
Glucose-Capillary: 106 mg/dL — ABNORMAL HIGH (ref 70–99)

## 2022-11-27 MED ORDER — AMIODARONE HCL 400 MG PO TABS
400.0000 mg | ORAL_TABLET | Freq: Every day | ORAL | 2 refills | Status: DC
  Filled 2022-11-27: qty 30, 30d supply, fill #0

## 2022-11-27 MED ORDER — AMIODARONE HCL 200 MG PO TABS: 400.0000 mg | ORAL_TABLET | Freq: Every day | ORAL | 2 refills | Status: DC

## 2022-11-27 NOTE — Progress Notes (Signed)
PT Cancellation Note/ DISCHARGE   Patient Details Name: Roy Sullivan MRN: [id number] DOB: 1950/01/18   Cancelled Treatment:    Reason Eval/Treat Not Completed: Other (comment). Pt underwent ICD placement on 11/15. Pt observed ambulating around unit indep without AD, family, or RN staff with him. Pt with no signs of instability and reports "feeling great." Pt has met all PT goals. Pt with no further acute PT needs at this time. PT SIGNING OFF. Please re-consult if needed in future.  [name], PT, DPT Acute Rehabilitation Services Secure chat preferred Office #: [phone]    Iona Hansen 11/27/2022, 11:54 AM

## 2022-11-27 NOTE — Progress Notes (Signed)
   Rounding Note    Patient Name: DIVON TEAGLE Date of Encounter: 11/27/2022  [facility] Cardiologist: None   Subjective   NAEO. ICD yesterday. NSVT last night around midnight.  Vital Signs    Vitals:   11/27/22 0300 11/27/22 0400 11/27/22 0500 11/27/22 0600  BP: 130/78 138/76 126/76 118/79  Pulse: (!) 55 (!) 58 (!) 51 (!) 46  Resp:  14    Temp:      TempSrc:      SpO2: 95% 95% 95% 94%  Weight:  81.7 kg    Height:        Intake/Output Summary (Last 24 hours) at 11/27/2022 0748 Last data filed at 11/26/2022 1100 Gross per 24 hour  Intake 169.93 ml  Output --  Net 169.93 ml      11/27/2022    4:00 AM 11/26/2022    5:00 AM 11/25/2022    6:00 AM  Last 3 Weights  Weight (lbs) 180 lb 1.9 oz 176 lb 6.4 oz 178 lb 12.7 oz  Weight (kg) 81.7 kg 80.015 kg 81.1 kg      Telemetry    Intermittent A pacing. NSVT last night around midnight. - Personally Reviewed   Physical Exam   GEN: No acute distress.   Cardiac: RRR, no murmurs, rubs, or gallops. CIED pocket well healed. Respiratory: Clear to auscultation bilaterally. Psych: Normal affect   Assessment & Plan    #VT #LV thinning/LGE Quiescent.  Now s/p ICD 11/26/2022. Device interrogation 11/16 shows stable lead parameters CXR 11/16 shows stable lead position.  Sarcoid PET planned Continue amiodarone 400mg  PO BID for now. Transition to 400mg  PO daily at discharge.      Sheria Lang T. Lalla Brothers, MD, [facility], [facility] Cardiac Electrophysiology

## 2022-11-27 NOTE — Progress Notes (Signed)
Advanced Heart Failure Rounding Note  PCP-Cardiologist: None   Subjective:    S/p ICD yesterday.   Feels good. CXR this am ok. (Seen by EP)  Brief run NSVT   Echo EF 50-55%, RV mildly reduced. No LVH    cMRI reviewed. Has small area of transmural scar in the inferolateral wall EF 50%  Objective:   Weight Range: 81.7 kg Body mass index is 26.6 kg/m.   Vital Signs:   Temp:  [98.2 F (36.8 C)-99.1 F (37.3 C)] 99.1 F (37.3 C) (11/16 0011) Pulse Rate:  [46-69] 66 (11/16 0800) Resp:  [12-16] 14 (11/16 0400) BP: (109-161)/(60-86) 116/69 (11/16 0800) SpO2:  [90 %-97 %] 94 % (11/16 0800) Weight:  [81.7 kg] 81.7 kg (11/16 0400) Last BM Date : 11/25/22  Weight change: Filed Weights   11/25/22 0600 11/26/22 0500 11/27/22 0400  Weight: 81.1 kg 80 kg 81.7 kg    Intake/Output:   Intake/Output Summary (Last 24 hours) at 11/27/2022 1049 Last data filed at 11/26/2022 1100 Gross per 24 hour  Intake 169.93 ml  Output --  Net 169.93 ml      Physical Exam    General:  Well appearing. No resp difficulty HEENT: normal Neck: supple. no JVD. Carotids 2+ bilat; no bruits. No lymphadenopathy or thryomegaly appreciated. Cor: PMI nondisplaced. Regular rate & rhythm. No rubs, gallops or murmurs. ICD site dressed. No hematoma Lungs: clear Abdomen: soft, nontender, nondistended. No hepatosplenomegaly. No bruits or masses. Good bowel sounds. Extremities: no cyanosis, clubbing, rash, edema Neuro: alert & orientedx3, cranial nerves grossly intact. moves all 4 extremities w/o difficulty. Affect pleasant   Telemetry   SB with intermittent a-pacing brief run NSVT  Labs    CBC Recent Labs    11/26/22 0304 11/27/22 0233  WBC 6.9 7.4  HGB 13.3 13.6  HCT 39.7 40.5  MCV 89.4 89.0  PLT 175 173   Basic Metabolic Panel Recent Labs    13/08/65 0408 11/26/22 0304 11/27/22 0233  NA 139 137 138  K 3.9 3.9 4.1  CL 106 105 106  CO2 25 23 24   GLUCOSE 94 101* 109*  BUN 18  16 23   CREATININE 1.11 1.37* 1.02  CALCIUM 8.6* 8.7* 8.8*  MG 2.0 1.8  --    Liver Function Tests No results for input(s): "AST", "ALT", "ALKPHOS", "BILITOT", "PROT", "ALBUMIN" in the last 72 hours.  No results for input(s): "LIPASE", "AMYLASE" in the last 72 hours. Cardiac Enzymes No results for input(s): "CKTOTAL", "CKMB", "CKMBINDEX", "TROPONINI" in the last 72 hours.  BNP: BNP (last 3 results) Recent Labs    11/23/22 2159  BNP 147.0*    ProBNP (last 3 results) No results for input(s): "PROBNP" in the last 8760 hours.   D-Dimer No results for input(s): "DDIMER" in the last 72 hours. Hemoglobin A1C No results for input(s): "HGBA1C" in the last 72 hours.  Fasting Lipid Panel No results for input(s): "CHOL", "HDL", "LDLCALC", "TRIG", "CHOLHDL", "LDLDIRECT" in the last 72 hours.  Thyroid Function Tests No results for input(s): "TSH", "T4TOTAL", "T3FREE", "THYROIDAB" in the last 72 hours.  Invalid input(s): "FREET3"  Other results:   Imaging    DG Chest 2 View  Result Date: 11/27/2022 CLINICAL DATA:  Cardiac device placement. EXAM: CHEST - 2 VIEW COMPARISON:  One-view chest x-ray 11/23/2022 FINDINGS: The patient was extubated.  Gastric tube was removed. The heart size is normal. Atherosclerotic calcifications are present at the aortic arch. Lung volumes are low. A dual lead AICD  was placed. Leads terminate over the right atrium and right ventricle. The inter via a left subclavian approach. No pneumothorax is present. IMPRESSION: 1. Dual lead AICD without radiographic evidence for complication. 2. Low lung volumes. Electronically Signed   By: Marin Roberts M.D.   On: 11/27/2022 06:47     Medications:     Scheduled Medications:  amiodarone  400 mg Oral BID   Chlorhexidine Gluconate Cloth  6 each Topical Daily   sodium chloride flush  10 mL Intracatheter Q12H   sodium chloride flush  3 mL Intravenous Q12H    Infusions:    PRN  Medications: acetaminophen, ondansetron (ZOFRAN) IV, polyethylene glycol, sodium chloride flush    Patient Profile   72 y/o male w/ no prior cardiac history, admitted for OOH VT cardiac arrest.    Assessment/Plan   1. 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 Prior chest CTs also w/ unusual calcified mass, felt to probably be sequelae of previous granulomatous infection, all raising concern for possible sarcoid - Echo EF 50-55%, RV mildly reduced. 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 Jimmey Ralph 11/26/22  - Plan PET CT within the next 1-2 weeks to check for active inflammation. If active, will need to start on sarcoid treatment protocol  - Can go home today on amiodarone 400 daily - No driving x 6 months - If PET scan positive for active inflammation will need immunosuppression   Length of Stay: 4  Arvilla Meres, MD  11/27/2022, 10:49 AM  Advanced Heart Failure Team Pager [phone] (M-F; 7a - 5p)  Please contact [facility] for night-coverage after hours (5p -7a ) and weekends on [email or web address]

## 2022-11-27 NOTE — Discharge Summary (Addendum)
Discharge Summary    Patient ID: [name] MRN: [id number]; DOB: 16-Apr-1950  Admit date: 11/23/2022 Discharge date: 11/27/2022  PCP:  Renford Dills, MD   [facility] Providers Cardiologist: New to Dr Croitoru >> Dr Gala Romney     Discharge Diagnoses    Principal Problem:   Cardiac arrest [facility]) Active Problems:   Cardiac sarcoidosis   Ventricular tachycardia [facility])    Diagnostic Studies/Procedures    Left heart catheterization 11/23/2022:  Fluoro time: 2.4 (min) DAP: 18805 (mGycm2) Cumulative Air Kerma: 282 (mGy)  1.  Widely patent left dominant coronary arteries with minimal plaquing but no obstructive CAD 2.  Mild segmental LV dysfunction with akinesis of the basal inferior wall, LVEF estimated at 45 to 50% 3.  Normal LVEDP   The patient appears to have had a nonischemic event.  Further management per the primary cardiology team   Diagnostic Dominance: Left     Echocardiogram from 11/24/2022:   1. Left ventricular ejection fraction, by estimation, is 50 to 55%. The  left ventricle has low normal function. The left ventricle demonstrates  regional wall motion abnormalities (see scoring diagram/findings for  description). The left ventricular  internal cavity size was mildly dilated. Left ventricular diastolic  parameters are indeterminate.   2. Right ventricular systolic function is normal. The right ventricular  size is mildly enlarged. There is normal pulmonary artery systolic  pressure. The estimated right ventricular systolic pressure is 32.4 mmHg.   3. The mitral valve is grossly normal. Mild mitral valve regurgitation.  No evidence of mitral stenosis.   4. The aortic valve is tricuspid. Aortic valve regurgitation is trivial.  No aortic stenosis is present.   5. The inferior vena cava is normal in size with greater than 50%  respiratory variability, suggesting right atrial pressure of 3 mmHg.   Cardiac MRI  11/24/2022:  IMPRESSION: 1.  Anterior mediastinal mass noted as above.  Reported on prior CT.   2.  Mildly dilated LV with basal inferolateral akinesis, EF 50%.   3.  Mildly dilated RV with EF 43%.   4. Thinning with near-transmural LGE in the basal to mid inferolateral wall. This is suggestive of prior MI in circumflex territory. However, in the absence of significant coronary disease, it is possible to see transmural/near-transmural LGE with cardiac sarcoidosis.   5. Extracellular volume percentage 34%, this is elevated suggesting increased myocardial fibrotic content.    ICD implant 11/26/2022:  SURGEON: [name], MD  PREPROCEDURE DIAGNOSES:  1. Ventricular fibrillation, cardiac arrest  POSTPROCEDURE DIAGNOSES:  Same as preprocedure.  PROCEDURES:  1. ICD implantation.  INTRODUCTION: Roy Sullivan is a 72 y.o. male with an out of hospital arrest due to ventricular fibrillation, EF 50%, and new diagnosis of likely cardiac sarcoidosis on cardiac MRI. At this time, he meets criteria for ICD implantation for secondary prevention of sudden death. The patient therefore presents today for dual chamber ICD implantation.   DESCRIPTION OF PROCEDURE: Informed written consent was obtained and the patient was brought to the electrophysiology lab in the fasting state. The patient was adequately sedated with intravenous Versed, and fentanyl as outlined in the nursing report. The patient's left chest was prepped and draped in the usual sterile fashion by the EP lab staff. The skin overlying the left deltopectoral region was infiltrated with lidocaine for local analgesia. An incision was created over the left deltopectoral region. A left subcutaneous defibrillator pocket was fashioned using a combination of sharp and blunt dissection. Electrocautery  was used to assure hemostasis.   Lead Placement: The left axillary vein was cannulated using ultrasound guidance. Through the left  axillary vein, a RV single coil ICD lead (model [id number], serial [id number]) was advanced to the RV apex. In the apex, sensed R waves were <5.76mV. The lead was then repositioned to the high mid septum where it displayed excelling pacing (0.5 V at 0.66ms) and sensing thresholds (12.9 mV) with an acceptable impedance (657 ohms). The lead was secured to the pectoral fascia. Next, the right atrial lead (model [id number], serial [id number]) was advanced to the RA appendage where it displayed excellent pacing (1.0 V at 0.62ms) and sensing (1.4 mV) thresholds with an acceptable impedance (656 ohms). It was secured to the pectoral fascia. The pocket was irrigated with copious vancomycin solution. The leads were then connected to a pulse generator (model Cobalt XT DR MRI SureScan, serial [id number]). The pocket was closed in layers of absorbable suture. The patient was then deeply sedated with additional fentanyl and Versed. A 1J shock on T wave was delivered through the device and induced sustained ventricular fibrillation. VF was recognized by the device appropriately and was successfully defibrillated to normal sinus rhythm with a single 30J shock. EBL < 10mL. Steri-strips and a sterile dressing were applied.  During this procedure the patient is administered a total of Versed 6.5 mg and Fentanyl 250 mcg to achieve and maintain moderate conscious sedation. The patient's heart rate, blood pressure, and oxygen saturation are monitored continuously during the procedure. The period of conscious sedation is 98 minutes, of which I was present face-to-face 100% of this time. The patient tolerated the procedure well and there were no immediately apparent complications.     Estimated blood loss <50 mL.   During this procedure no sedation was administered.     CONCLUSIONS:   1. Successful ICD implantation with a single coil ICD lead implanted for secondary prevention of sudden death.   2. Successful DFTs with a single 30J shock.    3. No early apparent complications.      [name], MD Cardiac Electrophysiology   History of Present Illness      Per admission H&P 11/23/2022 by Dr. Royann Shivers:  NUNZIO OLAFSON is a 72 y.o. male with PMH of nephrolithiasis and calcified anterior mediastinal mass who is was seen on 11/23/2022 for the evaluation of cardiac arrest.   Mr. Buchanon was in the gym exercising on the treadmill, as he does on a daily basis.  He had sudden collapse received bystander CPR and had to AED advised shocks.  He had return of spontaneous circulation within 5 minutes.  By the time EMS arrived his ECG showed normal sinus rhythm with PVCs and on the monitor he would have brief bursts of polymorphic VT.  On arrival to the emergency room he was moving spontaneously but not following commands or protecting his airway so he was endotracheally intubated.  Intravenous amiodarone was started by the ED staff due to bursts of polymorphic VT.   Upon initial cardiology evaluation, he remained somewhat agitated, requiring intravenous sedatives, but he would follow commands and had purposeful movements.   Initial ECG shows sinus rhythm with occasional PVCs and borderline QTc at 497 ms.  There is no evidence of ST segment deviation.  CT head and cervical spine showed no acute intracranial abnormality or any injury to the cervical spine.  Chest x-ray did not show evidence of heart failure.  Most labs were still pending.  Lactic acid  was mildly elevated at 2.9.   He may have mild chronic kidney disease (creatinine in 2021 was 1.28 and was 1.39 POA).  He does not have a history of hypertension, diabetes, family history of premature CAD or smoking.  There was no lipid profile available for review.  He was planning a coronary calcium score in the near future not due to any complaints or new medical findings, but just because one of his friends had had an unexpected coronary event.   Chest CTs performed in 2014 and 2018  showing unusual calcified mass, felt to probably be sequelae of previous granulomatous infection, just anterior to the ascending aorta, likely extra pericardial he in the right chest.  There is only very scanty calcification of the coronary arteries in the aorta.   He has a history of previous left ureteral stone causing mild left hydronephrosis.  The anterior mediastinal calcified mass was first diagnosed in 2014 during evaluation for kidney stones.     Hospital Course     Consultants: Advanced heart failure and EP  Out of the hospital cardiac arrest Polymorphic ventricular tachycardia Suspected cardiac sarcoidosis -Presented as OOH cardiac arrest, required AED shocks, noted with bursts of polymorphic VT on telemetry upon arrival, post ROSC EKG with sinus rhythm with frequent PVCs, no ST elevation, initial workup including K3.8, mag 1.8, high sensitive troponin 13>80, Cr 1.39, mild transaminitis -Urgent left heart catheterization 11/23/2022 revealed no obstructive CAD; mild segmental LV dysfunction with akinesis of basal inferior wall; LVEF 45-50%; normal LVEDP -Echocardiogram 12-05-22 revealing LVEF 50 to 55%, indeterminate diastolic parameter, normal RV and PASP, mild MR, trivial AI, hypokinetic LV posterior wall -Cardiac MRI Dec 05, 2022 revealing LVEF 50%, RVEF 43%, thinning with near transmural LGE in the basal to mid inferolateral wall, concerning for cardiac sarcoidosis in the absence of significant CAD -AHF consulted December 05, 2022, suspect patient has scar related VT due to cardiac sarcoid, loaded amiodarone, recommend outpatient PET, and ICD implant for secondary prevention of sudden cardiac death - EP consulted 2022/12/05, recommend hold off empiric immunosuppression until active inflammation confirmed PET, dual-chamber ICD implant prior to initiation of immunosuppression to reduce risk of device infection, and continue PO amiodarone 400mg  daily until PET results (if no inflammation may  transition to high-dose beta-blocker; if active inflammation may continue short course of amiodarone until completing anti-inflammatory regimen loading) -Patient was advised not to drive for 32-month -Amiod[facility]one 400 [facility], [facility] does not have amiodarone in stock, [facility] does have supply /confirmed  -Follow-up with advanced heart failure Dr. Gala Romney arranged on 11/30/22 (video visit) and 12/07/2022 in the office, will need PET  [employer] ICD in situ -Successful ICD implant 11/26/2022 -Device interrogation by Dr. Lalla Brothers today revealing stable lead parameter -Chest x-ray today revealed stable lead position and no complication -Postop instruction has been provided -Wound check 12/15/22 arranged -Message sent to EP coordinator to arrange follow-up today   AKI, resolved -Creatinine 1.39 with GFR 54, POA, unclear baseline -Cr 1.02 today  -Suspect cardiac arrest associated hypoperfusion  Calcified anterior mediastinal mass -Cardiac MRI revealing 2 cm x 2 cm anterior mediastinal mass, located anterior to the ascending aorta -Last CT chest without contrast completed in 2018 revealed stable calcified inferior anterior mediastinal mass largest measuring 2.8 x 2.4 cm; most consistent with calcified lymph node due to prior granulomatous disease -Will need routine imaging for surveillance, patient informed   History of nephrolithiasis -No acute issue currently     Did the patient have an acute  coronary syndrome (MI, NSTEMI, STEMI, etc) this admission?:  No                               Did the patient have a percutaneous coronary intervention (stent / angioplasty)?:  No.        The patient will be scheduled for a [facility] follow up appointment in 14 days.  A message has been sent to the [facility] and Scheduling Pool at the office where the patient should be seen for follow up.  _____________  Discharge Vitals Blood pressure 116/69, pulse 61, temperature 99.1 F (37.3 C),  temperature source Oral, resp. rate 14, height 5\' 9"  (1.753 m), weight 81.7 kg, SpO2 96%.  Filed Weights   11/25/22 0600 11/26/22 0500 11/27/22 0400  Weight: 81.1 kg 80 kg 81.7 kg   See attending progress note today for physical exam   Labs & Radiologic Studies    CBC Recent Labs    11/26/22 0304 11/27/22 0233  WBC 6.9 7.4  HGB 13.3 13.6  HCT 39.7 40.5  MCV 89.4 89.0  PLT 175 173   Basic Metabolic Panel Recent Labs    40/98/11 0408 11/26/22 0304 11/27/22 0233  NA 139 137 138  K 3.9 3.9 4.1  CL 106 105 106  CO2 25 23 24   GLUCOSE 94 101* 109*  BUN 18 16 23   CREATININE 1.11 1.37* 1.02  CALCIUM 8.6* 8.7* 8.8*  MG 2.0 1.8  --    Liver Function Tests No results for input(s): "AST", "ALT", "ALKPHOS", "BILITOT", "PROT", "ALBUMIN" in the last 72 hours. No results for input(s): "LIPASE", "AMYLASE" in the last 72 hours. High Sensitivity Troponin:   Recent Labs  Lab 11/23/22 1327 11/23/22 1711  TROPONINIHS 13 80*    BNP Invalid input(s): "POCBNP" D-Dimer No results for input(s): "DDIMER" in the last 72 hours. Hemoglobin A1C No results for input(s): "HGBA1C" in the last 72 hours. Fasting Lipid Panel No results for input(s): "CHOL", "HDL", "LDLCALC", "TRIG", "CHOLHDL", "LDLDIRECT" in the last 72 hours. Thyroid Function Tests No results for input(s): "TSH", "T4TOTAL", "T3FREE", "THYROIDAB" in the last 72 hours.  Invalid input(s): "FREET3" _____________  DG Chest 2 View  Result Date: 11/27/2022 CLINICAL DATA:  Cardiac device placement. EXAM: CHEST - 2 VIEW COMPARISON:  One-view chest x-ray 11/23/2022 FINDINGS: The patient was extubated.  Gastric tube was removed. The heart size is normal. Atherosclerotic calcifications are present at the aortic arch. Lung volumes are low. A dual lead AICD was placed. Leads terminate over the right atrium and right ventricle. The inter via a left subclavian approach. No pneumothorax is present. IMPRESSION: 1. Dual lead AICD without  radiographic evidence for complication. 2. Low lung volumes. Electronically Signed   By: Marin Roberts M.D.   On: 11/27/2022 06:47   EP PPM/ICD IMPLANT  Addendum Date: 11/26/2022    CONCLUSIONS:  1. Successful ICD implantation with a single coil ICD lead implanted for secondary prevention of sudden death.  2. Successful DFTs with a single 30J shock.  3. No early apparent complications. [name], MD Cardiac Electrophysiology  Result Date: 11/26/2022  CONCLUSIONS:  1. Successful ICD implantation with a single coil ICD lead implanted for secondary prevention of sudden death.  2. Successful DFTs with a single 30J shock.  3. No early apparent complications. [name], MD Cardiac Electrophysiology   MR CARDIAC VELOCITY FLOW MAP  Result Date: 11/24/2022 CLINICAL DATA:  VT, assess for cardiac  sarcoidosis. EXAM: CARDIAC MRI TECHNIQUE: The patient was scanned on a 1.5 Tesla GE magnet. A dedicated cardiac coil was used. Functional imaging was done using Fiesta sequences. 2,3, and 4 chamber views were done to assess for RWMA's. Modified Simpson's rule using a short axis stack was used to calculate an ejection fraction on a dedicated work [employer]. The patient received 10 cc of Gadavist. After 10 minutes inversion recovery sequences were used to assess for infiltration and scar tissue. FINDINGS: Limited images of the lung fields showed mild bibasilar atelectasis. There was a 2 cm x 2 cm anterior mediastinal mass, located anterior to the ascending aorta. Mildly dilated left ventricle with normal wall thickness. Basal inferolateral akinesis and thinning, LVEF 50%. Mildly dilated right ventricle, RV EF 43%. Mild biatrial enlargement. Trileaflet aortic valve, no significant regurgitation or stenosis. Visually, mitral regurgitation looks mild. On delayed enhancement imaging, there was near-transmural late gadolinium enhancement (LGE) in basal to mid inferolateral wall. MEASUREMENTS:  MEASUREMENTS LVEDV 229 mL LVEDVi 115 mL/m2 LVSV 115 mL LVEF 50% RVEDV 251 mL RVEDVi 126 mL/m2 RVSV 109 mL RVEF 43% Aortic forward volume 125 mL Aortic regurgitant fraction 0% T1 1104, ECV 34% IMPRESSION: 1.  Anterior mediastinal mass noted as above.  Reported on prior CT. 2.  Mildly dilated LV with basal inferolateral akinesis, EF 50%. 3.  Mildly dilated RV with EF 43%. 4. Thinning with near-transmural LGE in the basal to mid inferolateral wall. This is suggestive of prior MI in circumflex territory. However, in the absence of significant coronary disease, it is possible to see transmural/near-transmural LGE with cardiac sarcoidosis. 5. Extracellular volume percentage 34%, this is elevated suggesting increased myocardial fibrotic content. Dalton Mclean Electronically Signed   By: Marca Ancona M.D.   On: 11/24/2022 19:48   MR CARDIAC VELOCITY FLOW MAP  Result Date: 11/24/2022 CLINICAL DATA:  VT, assess for cardiac sarcoidosis. EXAM: CARDIAC MRI TECHNIQUE: The patient was scanned on a 1.5 Tesla GE magnet. A dedicated cardiac coil was used. Functional imaging was done using Fiesta sequences. 2,3, and 4 chamber views were done to assess for RWMA's. Modified Simpson's rule using a short axis stack was used to calculate an ejection fraction on a dedicated work [employer], [employer]. The patient received 10 cc of Gadavist. After 10 minutes inversion recovery sequences were used to assess for infiltration and scar tissue. FINDINGS: Limited images of the lung fields showed mild bibasilar atelectasis. There was a 2 cm x 2 cm anterior mediastinal mass, located anterior to the ascending aorta. Mildly dilated left ventricle with normal wall thickness. Basal inferolateral akinesis and thinning, LVEF 50%. Mildly dilated right ventricle, RV EF 43%. Mild biatrial enlargement. Trileaflet aortic valve, no significant regurgitation or stenosis. Visually, mitral regurgitation looks mild. On delayed enhancement imaging,  there was near-transmural late gadolinium enhancement (LGE) in basal to mid inferolateral wall. MEASUREMENTS: MEASUREMENTS LVEDV 229 mL LVEDVi 115 mL/m2 LVSV 115 mL LVEF 50% RVEDV 251 mL RVEDVi 126 mL/m2 RVSV 109 mL RVEF 43% Aortic forward volume 125 mL Aortic regurgitant fraction 0% T1 1104, ECV 34% IMPRESSION: 1.  Anterior mediastinal mass noted as above.  Reported on prior CT. 2.  Mildly dilated LV with basal inferolateral akinesis, EF 50%. 3.  Mildly dilated RV with EF 43%. 4. Thinning with near-transmural LGE in the basal to mid inferolateral wall. This is suggestive of prior MI in circumflex territory. However, in the absence of significant coronary disease, it is possible to see transmural/near-transmural LGE with cardiac  sarcoidosis. 5. Extracellular volume percentage 34%, this is elevated suggesting increased myocardial fibrotic content. Dalton Mclean Electronically Signed   By: Marca Ancona M.D.   On: 11/24/2022 19:48   MR CARDIAC MORPHOLOGY W WO CONTRAST  Result Date: 11/24/2022 CLINICAL DATA:  VT, assess for cardiac sarcoidosis. EXAM: CARDIAC MRI TECHNIQUE: The patient was scanned on a 1.5 Tesla GE magnet. A dedicated cardiac coil was used. Functional imaging was done using Fiesta sequences. 2,3, and 4 chamber views were done to assess for RWMA's. Modified Simpson's rule using a short axis stack was used to calculate an ejection fraction on a dedicated work [employer], [employer]. The patient received 10 cc of Gadavist. After 10 minutes inversion recovery sequences were used to assess for infiltration and scar tissue. FINDINGS: Limited images of the lung fields showed mild bibasilar atelectasis. There was a 2 cm x 2 cm anterior mediastinal mass, located anterior to the ascending aorta. Mildly dilated left ventricle with normal wall thickness. Basal inferolateral akinesis and thinning, LVEF 50%. Mildly dilated right ventricle, RV EF 43%. Mild biatrial enlargement. Trileaflet aortic valve, no  significant regurgitation or stenosis. Visually, mitral regurgitation looks mild. On delayed enhancement imaging, there was near-transmural late gadolinium enhancement (LGE) in basal to mid inferolateral wall. MEASUREMENTS: MEASUREMENTS LVEDV 229 mL LVEDVi 115 mL/m2 LVSV 115 mL LVEF 50% RVEDV 251 mL RVEDVi 126 mL/m2 RVSV 109 mL RVEF 43% Aortic forward volume 125 mL Aortic regurgitant fraction 0% T1 1104, ECV 34% IMPRESSION: 1.  Anterior mediastinal mass noted as above.  Reported on prior CT. 2.  Mildly dilated LV with basal inferolateral akinesis, EF 50%. 3.  Mildly dilated RV with EF 43%. 4. Thinning with near-transmural LGE in the basal to mid inferolateral wall. This is suggestive of prior MI in circumflex territory. However, in the absence of significant coronary disease, it is possible to see transmural/near-transmural LGE with cardiac sarcoidosis. 5. Extracellular volume percentage 34%, this is elevated suggesting increased myocardial fibrotic content. Dalton Mclean Electronically Signed   By: Marca Ancona M.D.   On: 11/24/2022 19:47   ECHOCARDIOGRAM COMPLETE  Result Date: 11/24/2022    ECHOCARDIOGRAM REPORT   Patient Name:   KENDRICK LEDYARD Date of Exam: 11/24/2022 Medical Rec #:  [id number]         Height:       69.0 in Accession #:    [id number]        Weight:       179.0 lb Date of Birth:  1950/07/13         BSA:          1.971 m Patient Age:    72 years          BP:           104/73 mmHg Patient Gender: M                 HR:           50 bpm. Exam Location:  Inpatient Procedure: 2D Echo, 3D Echo, Cardiac Doppler, Color Doppler and Strain Analysis Indications:    Cardiac Arrest  History:        Patient has no prior history of Echocardiogram examinations.                 Arrythmias:Bradycardia.  Sonographer:    Karma Ganja Referring Phys: [id number] MIHAI CROITORU  Sonographer Comments: Global longitudinal strain was attempted. IMPRESSIONS  1. Left ventricular ejection fraction, by estimation, is 50 to  55%. The left ventricle has low normal function. The left ventricle demonstrates regional wall motion abnormalities (see scoring diagram/findings for description). The left ventricular internal cavity size was mildly dilated. Left ventricular diastolic parameters are indeterminate.  2. Right ventricular systolic function is normal. The right ventricular size is mildly enlarged. There is normal pulmonary artery systolic pressure. The estimated right ventricular systolic pressure is 32.4 mmHg.  3. The mitral valve is grossly normal. Mild mitral valve regurgitation. No evidence of mitral stenosis.  4. The aortic valve is tricuspid. Aortic valve regurgitation is trivial. No aortic stenosis is present.  5. The inferior vena cava is normal in size with greater than 50% respiratory variability, suggesting right atrial pressure of 3 mmHg. FINDINGS  Left Ventricle: Left ventricular ejection fraction, by estimation, is 50 to 55%. The left ventricle has low normal function. The left ventricle demonstrates regional wall motion abnormalities. The left ventricular internal cavity size was mildly dilated. There is no left ventricular hypertrophy. Left ventricular diastolic parameters are indeterminate.  LV Wall Scoring: The posterior wall is hypokinetic. Right Ventricle: The right ventricular size is mildly enlarged. No increase in right ventricular wall thickness. Right ventricular systolic function is normal. There is normal pulmonary artery systolic pressure. The tricuspid regurgitant velocity is 2.71  m/s, and with an assumed right atrial pressure of 3 mmHg, the estimated right ventricular systolic pressure is 32.4 mmHg. Left Atrium: Left atrial size was normal in size. Right Atrium: Right atrial size was normal in size. Pericardium: There is no evidence of pericardial effusion. Mitral Valve: The mitral valve is grossly normal. Mild mitral valve regurgitation. No evidence of mitral valve stenosis. Tricuspid Valve: The  tricuspid valve is grossly normal. Tricuspid valve regurgitation is mild . No evidence of tricuspid stenosis. Aortic Valve: The aortic valve is tricuspid. Aortic valve regurgitation is trivial. No aortic stenosis is present. Aortic valve mean gradient measures 5.0 mmHg. Aortic valve peak gradient measures 9.1 mmHg. Aortic valve area, by VTI measures 2.69 cm. Pulmonic Valve: The pulmonic valve was grossly normal. Pulmonic valve regurgitation is not visualized. No evidence of pulmonic stenosis. Aorta: The aortic root and ascending aorta are structurally normal, with no evidence of dilitation. Venous: The inferior vena cava is normal in size with greater than 50% respiratory variability, suggesting right atrial pressure of 3 mmHg. IAS/Shunts: The atrial septum is grossly normal.  LEFT VENTRICLE PLAX 2D LVIDd:         5.90 cm      Diastology LVIDs:         4.60 cm      LV e' medial:    7.62 cm/s LV PW:         0.90 cm      LV E/e' medial:  12.4 LV IVS:        0.90 cm      LV e' lateral:   10.90 cm/s LVOT diam:     2.10 cm      LV E/e' lateral: 8.6 LV SV:         88 LV SV Index:   45 LVOT Area:     3.46 cm                              3D Volume EF: LV Volumes (MOD)            3D EF:        62 % LV vol d, MOD A2C:  158.0 ml LV EDV:       156 ml LV vol d, MOD A4C: 154.5 ml LV ESV:       59 ml LV vol s, MOD A2C: 67.3 ml  LV SV:        97 ml LV vol s, MOD A4C: 61.0 ml LV SV MOD A2C:     90.7 ml LV SV MOD A4C:     154.5 ml LV SV MOD BP:      93.6 ml RIGHT VENTRICLE             IVC RV Basal diam:  4.90 cm     IVC diam: 1.50 cm RV S prime:     16.00 cm/s TAPSE (M-mode): 2.9 cm LEFT ATRIUM            Index        RIGHT ATRIUM           Index LA diam:      3.90 cm  1.98 cm/m   RA Area:     21.70 cm LA Vol (A2C): 156.0 ml 79.15 ml/m  RA Volume:   63.45 ml  32.19 ml/m LA Vol (A4C): 41.1 ml  20.85 ml/m  AORTIC VALVE AV Area (Vmax):    2.47 cm AV Area (Vmean):   2.42 cm AV Area (VTI):     2.69 cm AV Vmax:           150.50  cm/s AV Vmean:          100.250 cm/s AV VTI:            0.326 m AV Peak Grad:      9.1 mmHg AV Mean Grad:      5.0 mmHg LVOT Vmax:         107.35 cm/s LVOT Vmean:        70.000 cm/s LVOT VTI:          0.254 m LVOT/AV VTI ratio: 0.78  AORTA Ao Root diam: 3.60 cm Ao Asc diam:  3.60 cm MITRAL VALVE               TRICUSPID VALVE MV Area (PHT): 3.32 cm    TR Peak grad:   29.4 mmHg MV Decel Time: 229 msec    TR Vmax:        271.00 cm/s MV E velocity: 94.05 cm/s MV A velocity: 87.25 cm/s  SHUNTS MV E/A ratio:  1.08        Systemic VTI:  0.25 m                            Systemic Diam: 2.10 cm Lennie Odor MD Electronically signed by Lennie Odor MD Signature Date/Time: 11/24/2022/10:52:39 AM    Final    DG Chest Port 1 View  Result Date: 11/23/2022 CLINICAL DATA:  Cardiac arrest.  Evaluate support apparatuses. EXAM: PORTABLE CHEST 1 VIEW COMPARISON:  None Available. FINDINGS: Endotracheal tube is 7.4 cm above the carina. Nasogastric tube extends into the abdomen but the tip is not clearly identified. Probable atelectasis at the left lung base. Otherwise, no focal airspace disease or pulmonary edema. Negative for a pneumothorax. Heart size is within normal limits. IMPRESSION: 1. Endotracheal tube is 7.4 cm above the carina. 2. Possible left basilar atelectasis. Otherwise, no focal lung disease. Electronically Signed   By: Richarda Overlie M.D.   On: 11/23/2022 17:13   CARDIAC CATHETERIZATION  Result Date: 11/23/2022  1.  Widely patent left dominant coronary arteries with minimal plaquing but no obstructive CAD 2.  Mild segmental LV dysfunction with akinesis of the basal inferior wall, LVEF estimated at 45 to 50% 3.  Normal LVEDP The patient appears to have had a nonischemic event.  Further management per the primary cardiology team   CT Head Wo Contrast  Result Date: 11/23/2022 CLINICAL DATA:  Mental status change, unknown cause; Polytrauma, blunt arrest, fall, trauma EXAM: CT HEAD WITHOUT CONTRAST CT CERVICAL  SPINE WITHOUT CONTRAST TECHNIQUE: Multidetector CT imaging of the head and cervical spine was performed following the standard protocol without intravenous contrast. Multiplanar CT image reconstructions of the cervical spine were also generated. RADIATION DOSE REDUCTION: This exam was performed according to the departmental dose-optimization program which includes automated exposure control, adjustment of the mA and/or kV according to patient size and/or use of iterative reconstruction technique. COMPARISON:  None Available. FINDINGS: CT HEAD FINDINGS Brain: No evidence of acute infarction, hemorrhage, hydrocephalus, extra-axial collection or mass lesion/mass effect. Vascular: No hyperdense vessel or unexpected calcification. Skull: Normal. Negative for fracture or focal lesion. Sinuses/Orbits: No middle ear or mastoid effusion. Paranasal sinuses are notable polypoid mucosal thickening in the left maxillary sinus. Orbits are unremarkable. Other: There is layering fluid in the oropharynx CT CERVICAL SPINE FINDINGS Alignment: Grade 1 anterolisthesis of C4 on C5. Skull base and vertebrae: No acute fracture. No primary bone lesion or focal pathologic process. Soft tissues and spinal canal: No prevertebral fluid or swelling. No visible canal hematoma. Disc levels:  No evidence of high-grade spinal stenosis. Upper chest: Negative. Other: None IMPRESSION: 1. No acute intracranial abnormality. 2. No acute fracture or traumatic subluxation of the cervical spine. Electronically Signed   By: Lorenza Cambridge M.D.   On: 11/23/2022 14:20   CT Cervical Spine Wo Contrast  Result Date: 11/23/2022 CLINICAL DATA:  Mental status change, unknown cause; Polytrauma, blunt arrest, fall, trauma EXAM: CT HEAD WITHOUT CONTRAST CT CERVICAL SPINE WITHOUT CONTRAST TECHNIQUE: Multidetector CT imaging of the head and cervical spine was performed following the standard protocol without intravenous contrast. Multiplanar CT image reconstructions  of the cervical spine were also generated. RADIATION DOSE REDUCTION: This exam was performed according to the departmental dose-optimization program which includes automated exposure control, adjustment of the mA and/or kV according to patient size and/or use of iterative reconstruction technique. COMPARISON:  None Available. FINDINGS: CT HEAD FINDINGS Brain: No evidence of acute infarction, hemorrhage, hydrocephalus, extra-axial collection or mass lesion/mass effect. Vascular: No hyperdense vessel or unexpected calcification. Skull: Normal. Negative for fracture or focal lesion. Sinuses/Orbits: No middle ear or mastoid effusion. Paranasal sinuses are notable polypoid mucosal thickening in the left maxillary sinus. Orbits are unremarkable. Other: There is layering fluid in the oropharynx CT CERVICAL SPINE FINDINGS Alignment: Grade 1 anterolisthesis of C4 on C5. Skull base and vertebrae: No acute fracture. No primary bone lesion or focal pathologic process. Soft tissues and spinal canal: No prevertebral fluid or swelling. No visible canal hematoma. Disc levels:  No evidence of high-grade spinal stenosis. Upper chest: Negative. Other: None IMPRESSION: 1. No acute intracranial abnormality. 2. No acute fracture or traumatic subluxation of the cervical spine. Electronically Signed   By: Lorenza Cambridge M.D.   On: 11/23/2022 14:20   Disposition   Patient is seen by Dr. Lalla Brothers and Dr. Gala Romney today, deemed stable for discharge to home.  Post-cath care, post ICD care, medication change, follow-up plan reviewed with the patient via phone in detail.  All question  answered to satisfaction.  Medication sent to [facility] pharmacy today.  Pt is being discharged home today in good condition.  Follow-up Plans & Appointments     Follow-up Information     [facility] Follow up on 12/07/2022.   Specialty: Cardiology Why: Advanced Heart Failure Clinic 3:40 PM Entrance C, Free Valet  Parking Please bring all medications to appointment. Contact information: [address] [place] [address] [phone]        [facility] Follow up on 12/15/2022.   Specialty: Cardiology Why: at 2pm for your wound check from ICD implant Contact information: [address], [address] [place] [address] [phone]        Bensimhon, Bevelyn Buckles, MD Follow up on 11/30/2022.   Specialty: Cardiology Why: This is a Mychart Video visit on 11/30/22 at 8:40am, please attend through your APP at home Contact information: [address] [place] [address] [phone]                Discharge Instructions     Diet - low sodium heart healthy   Complete by: As directed    Discharge instructions   Complete by: As directed    NO driving for 6 months, please  Radial Site Care  Refer to this sheet in the next few weeks. These instructions provide you with information on caring for yourself after your procedure. Your caregiver may also give you more specific instructions. Your treatment has been planned according to current medical practices, but problems sometimes occur. Call your caregiver if you have any problems or questions after your procedure.  HOME CARE INSTRUCTIONS You may shower the day after the procedure. Remove the bandage (dressing) and gently wash the site with plain soap and water. Gently pat the site dry.  Do not apply powder or lotion to the site.  Do not submerge the affected site in water for 3 to 5 days.  Inspect the site at least twice daily.  Do not flex or bend the affected arm for 24 hours.  No lifting over 5 pounds (2.3 kg) for 5 days after your procedure.  Do not drive home if you are discharged the same day of the procedure. Have someone else drive you.  You may drive 24 hours after the procedure unless otherwise instructed by your caregiver.   What to expect: Any  bruising will usually fade within 1 to 2 weeks.  Blood that collects in the tissue (hematoma) may be painful to the touch. It should usually decrease in size and tenderness within 1 to 2 weeks.   SEEK IMMEDIATE MEDICAL CARE IF: You have unusual pain at the radial site.  You have redness, warmth, swelling, or pain at the radial site.  You have drainage (other than a small amount of blood on the dressing).  You have chills.  You have a fever or persistent symptoms for more than 72 hours.  You have a fever and your symptoms suddenly get worse.  Your arm becomes pale, cool, tingly, or numb.  You have heavy bleeding from the site. Hold pressure on the site.   Increase activity slowly   Complete by: As directed         Discharge Medications   Allergies as of 11/27/2022   No Known Allergies      Medication List     TAKE these medications    amiodarone 200 MG  tablet Commonly known as: Pacerone Take 2 tablets (400 mg total) by mouth daily.   fish oil-omega-3 fatty acids 1000 MG capsule Take 1 g by mouth daily.   Flomax 0.4 MG Caps capsule Generic drug: tamsulosin Take 0.4 mg by mouth daily.   multivitamin with minerals Tabs tablet Take 1 tablet by mouth daily.           Outstanding Labs/Studies     Duration of Discharge Encounter   Greater than 30 minutes including physician time.  Signed, Cyndi Bender, NP 11/27/2022, 1:45 PM

## 2022-11-30 ENCOUNTER — Encounter (HOSPITAL_COMMUNITY): Payer: Self-pay

## 2022-11-30 ENCOUNTER — Ambulatory Visit (HOSPITAL_COMMUNITY)
Admit: 2022-11-30 | Discharge: 2022-11-30 | Disposition: A | Discharge status: Home / Self Care | Payer: Medicare HMO | Attending: Internal Medicine | Admitting: Internal Medicine

## 2022-11-30 ENCOUNTER — Telehealth: Payer: Self-pay

## 2022-11-30 DIAGNOSIS — I4901 Ventricular fibrillation: Secondary | ICD-10-CM | POA: Diagnosis not present

## 2022-11-30 DIAGNOSIS — D8685 Sarcoid myocarditis: Secondary | ICD-10-CM

## 2022-11-30 MED FILL — Gentamicin Sulfate Inj 40 MG/ML: INTRAMUSCULAR | Qty: 80 | Status: AC

## 2022-11-30 NOTE — Addendum Note (Signed)
Encounter addended by: Baird Cancer, RN on: 11/30/2022 10:03 AM  Actions taken: Visit diagnoses modified, Order list changed, Diagnosis association updated, Clinical Note Signed

## 2022-11-30 NOTE — Telephone Encounter (Signed)
LM - returning patient's call

## 2022-11-30 NOTE — Telephone Encounter (Signed)
The patient has questions about about his ICD and would like for the nurse to give him a call back.

## 2022-11-30 NOTE — Patient Instructions (Signed)
Medication Changes:  No Changes In Medications at this time.   Testing/Procedures:   Cardiac Sarcoidosis/Inflammation PET Scan Patient Instructions  Please report to Radiology at the [facility] Main Entrance 15 minutes early for your test.  [address] [place], [place] [address] BRING FOOD DIARY WITH YOU TO THIS APPOINTMENT For 24 hours before the test: Do not exercise! Do not eat after 5 pm the day before your test! To make sure that your scanning results are accurate, you MUST follow the sarcoid prep meal diet starting the day before your PET scan. This diet involves eating no carbohydrates 24 hours before the test.  You will keep a log of all that you eat the day before your test. If you have questions or do not understand this diet, please call [phone] for more information. If you are unable to follow this diet, please discuss an alternative strategy with the coordinator.  If you are diabetic, continue your diabetes medications as usual on the day before until you begin to fast. NO DIABETES MEDICATIONS ONCE YOU BEGIN TO FAST. What foods can I eat the day before my test?  Drink only water or black coffee (WITHOUT sugar, artificial sweetener, cream, or milk). Eggs (prepared without milk or cheese)  Meat that is either broiled or pan fried in butter WITHOUT breading (chicken, [place], bacon, meat-only sausage, hamburger, steak, fish) Butter, salt & pepper What foods must I AVOID the day before my test?  Do not consume alcoholic beverages, sodas, fruit juice, coffee creamer, or sports drinks  Do not eat vegetables, beans, nuts, fruits, juices, bread, grains, rice, pasta, potatoes, or any baked goods Do not eat dairy products (milk, cheese, etc.)  Do not eat mayonnaise, ketchup, tartar sauce, mustard, or other condiments Do not add sugar, artificial sweeteners, or Splenda (sucralose) to foods or drinks  Do not eat breaded foods (like fried chicken)  Do not eat  sweets, candy, gum, sweetened cough drops, lozenges, or sugar  Do not eat sweetened, grilled, or cured meats or meat with carbohydrate-containing additives (some sausages, ham, sweetened bacon) Suggested items for breakfast, lunch, or dinner:  Breakfast  3 to 5 fatty sausage links fried in butter. 3 to 5 bacon strips.  3 eggs pan fried in butter (no milk or cheese).  Lunch/Dinner  2 hamburger patties fried in butter. Chicken or fatty fish pan fried in butter. No breading. 8 oz. fatty steak pan fried in butter.  Beverages  Drink only water or black coffee. DO NOT ADD SUGAR, ARTIFICIAL SWEETENER, CREAM, OR MILK   For more information and frequently asked questions, please visit our website : [email or web address]    Cardiac Sarcoidosis/Inflammation PET Scan  Food Diary Name: _____________________________ Please fill in EXACTLY what you have eaten and when for 24 hours PRIOR to your test date.  Time Food/Drink Comments  Breakfast                Lunch                Dinner                Snacks                 DO NOT EXERCISE THE DAY BEFORE YOUR TEST DO NOT EAT AFTER 5 PM THE DAY BEFORE YOUR TEST.  ON THE DAY OF YOUR TEST, DO NOT EAT ANY FOOD AND ONLY DRINK CLEAR WATER! PLEASE BRING THIS FOOD DIARY WITH YOU TO YOUR  APPOINTMENT  At the [facility], you and your health needs are our priority. We have a designated team specialized in the treatment of Heart Failure. This Care Team includes your primary Heart Failure Specialized Cardiologist (physician), Advanced Practice Providers (APPs- Physician Assistants and Nurse Practitioners), and Pharmacist who all work together to provide you with the care you need, when you need it.   You may see any of the following providers on your designated Care Team at your next follow up:  Dr. Arvilla Meres Dr. Marca Ancona Dr. Dorthula Nettles Dr. Theresia Bough Tonye Becket, NP Robbie Lis, [place] [facility] [place], [place] Brynda Peon, NP [place] Lee, NP Karle Plumber, PharmD   Please be sure to bring in all your medications bottles to every appointment.   Need to Contact us:  If you have any questions or concerns before your next appointment please send [place] a message through [place] or call our office at [phone].    TO LEAVE A MESSAGE FOR THE NURSE SELECT OPTION 2, PLEASE LEAVE A MESSAGE INCLUDING: YOUR NAME DATE OF BIRTH CALL BACK NUMBER REASON FOR CALL**this is important as we prioritize the call backs  YOU WILL RECEIVE A CALL BACK THE SAME DAY AS LONG AS YOU CALL BEFORE 4:00 PM

## 2022-11-30 NOTE — Progress Notes (Signed)
Heart Failure TeleHealth Note  Patient unable to travel to clinic today. Audio/video telehealth visit is felt to be most appropriate for this patient at this time.  See MyChart message from today for patient consent regarding telehealth for [facility].  Date:  11/30/2022   ID:  Roy Sullivan, DOB June 29, 1950, MRN [id number]  Location: Home  Provider location: [facility] Type of Visit: Established patient/post-hospital   PCP:  Renford Dills, MD  Cardiologist:  None Primary HF: DB  HPI:   Patient identity confirmed. Patient at his home. I was in the HF Clinic.   Roy Sullivan is a 72 y/o male with h/o stable calcified mediastinal granulomas admitted on 11/23/22 with VF arrest during exercise. Received immediate bystander CPR and defibrillation in the field.  Brought to [facility]. Urgent left heart catheterization 11/23/2022 revealed no obstructive CAD; mild segmental LV dysfunction with akinesis of basal inferior wall; LVEF 45-50%; normal LVEDP. 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 ACE level negative  Seen by EP and HF teams. 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 Due to VF and bradycardia, dual-chamber ICD implant prior to initiation of immunosuppression to reduce risk of device infection, and continue PO amiodarone 400mg  daily until PET results (if no inflammation may transition to high-dose beta-blocker +/- mexilitene to help suppress PVCs; if  active inflammation may continue short course of amiodarone until completing immunosuppressive regimen loading)   Feels great. Walking regularly. No palpitations. No problems with ICD site. No ICD firings. Chest still a bit sore from CPR  Past Medical History:  Diagnosis Date   Dysrhythmia    pt unaware of diagnosis   History of kidney stones    Renal disorder    Past Surgical History:  Procedure Laterality Date   BACK SURGERY     1989 and1994   CYSTOSCOPY/RETROGRADE/URETEROSCOPY/STONE EXTRACTION WITH BASKET Left 05/01/2012   Procedure: CYSTOSCOPY/LEFT RETROGRADE PYELOGRAM/LEFT URETEROSCOPY/STONE EXTRACTION WITH BASKET;  Surgeon: Kathi Ludwig, MD;  Location: [facility];  Service: Urology;  Laterality: Left;      STONE BASKET EXTRACTION POSSIBLE JJ STENT PLACEMENT  C-ARM    EXTRACORPOREAL SHOCK WAVE LITHOTRIPSY Left 08/14/2018   Procedure: EXTRACORPOREAL SHOCK WAVE LITHOTRIPSY (ESWL);  Surgeon: Sebastian Ache, MD;  Location: [facility];  Service: Urology;  Laterality: Left;   HOLMIUM LASER APPLICATION Left 05/01/2012   Procedure: HOLMIUM LASER APPLICATION;  Surgeon: Kathi Ludwig, MD;  Location: [facility];  Service: Urology;  Laterality: Left;  FRAGMENTATION    ICD IMPLANT N/A 11/26/2022   Procedure: ICD IMPLANT;  Surgeon: [name], MD;  Location: [facility] INVASIVE CV LAB;  Service: Cardiovascular;  Laterality: N/A;   LEFT HEART CATH AND CORONARY ANGIOGRAPHY N/A 11/23/2022   Procedure: LEFT HEART CATH AND CORONARY ANGIOGRAPHY;  Surgeon: Tonny Bollman, MD;  Location: [facility] INVASIVE CV LAB;  Service: Cardiovascular;  Laterality: N/A;   LUMBAR LAMINECTOMY  1994, 1989   L4-5, S1   STONE EXTRACTION WITH BASKET  2001     Current Outpatient Medications  Medication Sig Dispense Refill  amiodarone (PACERONE) 200 MG tablet Take 2 tablets (400 mg total) by mouth daily. 60 tablet 2   fish oil-omega-3 fatty acids 1000 MG capsule Take 1 g by mouth daily.     Multiple Vitamin  (MULITIVITAMIN WITH MINERALS) TABS Take 1 tablet by mouth daily.     potassium citrate (UROCIT-K) 10 MEQ (1080 MG) SR tablet Take 10 mEq by mouth 2 (two) times daily.     rosuvastatin (CRESTOR) 5 MG tablet Take 5 mg by mouth daily.     tamsulosin (FLOMAX) 0.4 MG CAPS capsule Take 0.4 mg by mouth daily.     No current facility-administered medications for this encounter.    Allergies:   Patient has no known allergies.   Social History:  The patient  reports that he has never smoked. He has never used smokeless tobacco. He reports current alcohol use. He reports that he does not use drugs.   Family History:  The patient's Family history is unknown by patient.   ROS:  Please see the history of present illness.   All other systems are personally reviewed and negative.   Exam:  (Video/Tele Health Call; Exam is subjective and or/visual.) General:  Speaks in full sentences. No resp difficulty. Chest: ICD site with tegaderm. Looks good Lungs: Normal respiratory effort with conversation.  Abdomen: Non-distended per patient report Extremities: Pt denies edema. Neuro: Alert & oriented x 3.   Recent Labs: 11/23/2022: ALT 47; B Natriuretic Peptide 147.0 11/26/2022: Magnesium 1.8 11/27/2022: BUN 23; Creatinine, Ser 1.02; Hemoglobin 13.6; Platelets 173; Potassium 4.1; Sodium 138  Personally reviewed   Wt Readings from Last 3 Encounters:  11/27/22 81.7 kg (180 lb 1.9 oz)  08/14/18 80.3 kg (177 lb)  05/14/18 83.9 kg (185 lb)      ASSESSMENT AND PLAN:  1.  VT/VF arrest - suspect scar-mediated - LHC 11/23/2022  no obstructive CAD; mild segmental LV dysfunction with akinesis of basal inferior wall; LVEF 45-50%; normal LVEDP. - Echo 11/24/2022 LVEF 50-55%, indeterminate diastolic parameter, normal RV and PASP, mild MR, trivial AI, hypokinetic LV posterior wall - cMRI 11/24/2022 LVEF 50%, RVEF 43%, thinning with near transmural LGE in the basal to mid inferolateral wall, concerning for cardiac  sarcoidosis in the absence of significant CAD - s/p dual chamber MDT ICD Jimmey Ralph) - continue amio 400 daily for now. Can wean if PET negative - Plan PET CT to look for active sarcoid - If sarcoid active will need immunosuppresive therapy - No driving x 6 months - D/w EP. Ok to take tegaderm down   2. Anterior mediastinal calcified granulomas - stable by previous CT - Raises suspicion for previous sarcoid - PET scan pending  Signed, Arvilla Meres, MD  11/30/2022 9:31 AM  [facility] [address] [facility] [place] [address] [phone] (office) [phone] (fax)

## 2022-12-01 NOTE — Telephone Encounter (Signed)
Spoke to patient about removing the large outer dressing, steri strips will be in place and to leave dry until wound check. Patient appreciative of assistance.

## 2022-12-03 ENCOUNTER — Telehealth: Payer: Self-pay | Admitting: *Deleted

## 2022-12-07 ENCOUNTER — Encounter (HOSPITAL_COMMUNITY): Payer: Medicare HMO | Admitting: Internal Medicine

## 2022-12-13 NOTE — Patient Instructions (Signed)
   After Your ICD (Implantable Cardiac Defibrillator)    Monitor your defibrillator site for redness, swelling, and drainage. Call the device clinic at [phone] if you experience these symptoms or fever/chills.  Your incision was closed with Steri-strips or staples:  You may shower 7 days after your procedure and wash your incision with soap and water. Avoid lotions, ointments, or perfumes over your incision until it is well-healed.  You may use a hot tub or a pool after your wound check appointment if the incision is completely closed.  Do not lift, push or pull greater than 10 pounds with the affected arm until 6 weeks after your procedure.UNTIL AFTER DECEMBER 27TH. There are no other restrictions in arm movement after your wound check appointment.   Your ICD is designed to protect you from life threatening heart rhythms. Because of this, you may receive a shock.   1 shock with no symptoms:  Call the office during business hours. 1 shock with symptoms (chest pain, chest pressure, dizziness, lightheadedness, shortness of breath, overall feeling unwell):  Call 911. If you experience 2 or more shocks in 24 hours:  Call 911. If you receive a shock, you should not drive.  [place] DMV - no driving for 6 months if you receive appropriate therapy from your ICD.   ICD Alerts:  Some alerts are vibratory and others beep. These are NOT emergencies. Please call our office to let us know. If this occurs at night or on weekends, it can wait until the next business day. Send a remote transmission.  If your device is capable of reading fluid status (for heart failure), you will be offered monthly monitoring to review this with you.   Remote monitoring is used to monitor your ICD from home. This monitoring is scheduled every 91 days by our office. It allows [place] to keep an eye on the functioning of your device to ensure it is working properly. You will routinely see your Electrophysiologist annually (more  often if necessary).

## 2022-12-14 DIAGNOSIS — Z8674 Personal history of sudden cardiac arrest: Secondary | ICD-10-CM | POA: Diagnosis not present

## 2022-12-14 DIAGNOSIS — Z872 Personal history of diseases of the skin and subcutaneous tissue: Secondary | ICD-10-CM | POA: Diagnosis not present

## 2022-12-14 DIAGNOSIS — B078 Other viral warts: Secondary | ICD-10-CM | POA: Diagnosis not present

## 2022-12-14 DIAGNOSIS — L814 Other melanin hyperpigmentation: Secondary | ICD-10-CM | POA: Diagnosis not present

## 2022-12-14 DIAGNOSIS — Z09 Encounter for follow-up examination after completed treatment for conditions other than malignant neoplasm: Secondary | ICD-10-CM | POA: Diagnosis not present

## 2022-12-14 DIAGNOSIS — Z95810 Presence of automatic (implantable) cardiac defibrillator: Secondary | ICD-10-CM | POA: Diagnosis not present

## 2022-12-14 DIAGNOSIS — L821 Other seborrheic keratosis: Secondary | ICD-10-CM | POA: Diagnosis not present

## 2022-12-14 DIAGNOSIS — D225 Melanocytic nevi of trunk: Secondary | ICD-10-CM | POA: Diagnosis not present

## 2022-12-15 ENCOUNTER — Ambulatory Visit: Payer: Medicare HMO | Attending: Internal Medicine

## 2022-12-15 DIAGNOSIS — I4901 Ventricular fibrillation: Secondary | ICD-10-CM | POA: Diagnosis not present

## 2022-12-16 ENCOUNTER — Other Ambulatory Visit (HOSPITAL_COMMUNITY): Payer: Self-pay | Admitting: *Deleted

## 2022-12-16 MED ORDER — AMOXICILLIN 500 MG PO CAPS: 2000.0000 mg | ORAL_CAPSULE | Freq: Once | ORAL | 0 refills | Status: AC

## 2022-12-16 NOTE — Progress Notes (Signed)
Wound check appointment. Steri-strips removed. Wound without redness or edema. Incision edges approximated, wound well healed. Normal device function. Thresholds, sensing, and impedances consistent with implant measurements. Device programmed at 3.5V for extra safety margin until 3 month visit. Histogram distribution appropriate for patient and level of activity. No mode switches.  Brief ventricular arrhythmia noted. Patient educated about wound care, arm mobility, lifting restrictions, shock plan. ROV in 3 months with implanting physician.  Discussed rate response feature with implanting physician and patient.  At this time patient would like to leave rate response on.

## 2022-12-22 ENCOUNTER — Telehealth: Payer: Self-pay

## 2022-12-22 NOTE — Telephone Encounter (Signed)
Call received from Pt.  Per Pt he has noticed that he is waking up at night.  He states this is not normal for him.  He wonders if it is related to his pacemaker kicking in when he is sleeping and raising his heart rate higher than he is accustomed to.  Pt has a low resting heart rate.  Per review of Pt programming his sleep function is currently off.  Discussed with [employer] representative.  Verified that if sleep function were to be turned on, if Pt were to awaken and need to ambulate his rate response would override the sleep function.    Discussed with Dr. Jimmey Ralph.  Ok to turn on sleep function with sleep rate of 35 bpm.  Outreach made to Pt.  Advised of above.  Pt would like to have this programming change made.  He will stop by office this Friday afternoon.

## 2022-12-23 ENCOUNTER — Encounter: Payer: Self-pay | Admitting: Cardiology

## 2022-12-24 NOTE — Telephone Encounter (Signed)
Pt seen in device clinic for reprogramming.  Sleep function turned on. Sleep rate 35 bpm.   Sleep time 11:30 pm-7:00 am.

## 2022-12-31 ENCOUNTER — Ambulatory Visit (HOSPITAL_BASED_OUTPATIENT_CLINIC_OR_DEPARTMENT_OTHER): Payer: Medicare HMO

## 2022-12-31 ENCOUNTER — Encounter (HOSPITAL_BASED_OUTPATIENT_CLINIC_OR_DEPARTMENT_OTHER): Payer: Self-pay

## 2022-12-31 ENCOUNTER — Telehealth (HOSPITAL_COMMUNITY): Payer: Self-pay | Admitting: *Deleted

## 2022-12-31 NOTE — Telephone Encounter (Signed)
Reaching out to patient to offer assistance regarding upcoming cardiac imaging study; pt verbalizes understanding of appt date/time, parking situation and where to check in, pre-test NPO status; name and call back number provided for further questions should they arise  Larey Brick RN Navigator Cardiac Imaging [facility] [phone] office [phone] cell  Patient verbalized understanding of diet prep.

## 2023-01-04 ENCOUNTER — Encounter (HOSPITAL_COMMUNITY)
Admission: RE | Admit: 2023-01-04 | Discharge: 2023-01-04 | Disposition: A | Discharge status: Home / Self Care | Payer: Medicare HMO | Source: Ambulatory Visit | Attending: Internal Medicine | Admitting: Internal Medicine

## 2023-01-04 DIAGNOSIS — D8685 Sarcoid myocarditis: Secondary | ICD-10-CM

## 2023-01-04 DIAGNOSIS — I2510 Atherosclerotic heart disease of native coronary artery without angina pectoris: Secondary | ICD-10-CM | POA: Diagnosis not present

## 2023-01-04 DIAGNOSIS — I4901 Ventricular fibrillation: Secondary | ICD-10-CM | POA: Diagnosis not present

## 2023-01-04 LAB — NM PET CT MYOCARDIAL SARCOIDOSIS: Nuc Stress EF: 49 %

## 2023-01-04 MED ORDER — FLUDEOXYGLUCOSE F - 18 (FDG) INJECTION: 9.9000 | Freq: Once | INTRAVENOUS | Status: DC | PRN

## 2023-01-04 MED ORDER — RUBIDIUM RB82 GENERATOR (RUBYFILL): 21.1000 | PACK | Freq: Once | INTRAVENOUS | Status: DC

## 2023-01-18 ENCOUNTER — Telehealth (HOSPITAL_COMMUNITY): Payer: Self-pay | Admitting: Cardiology

## 2023-01-18 NOTE — Telephone Encounter (Signed)
 Roy Sullivan with [employer] left VM on triage line to request clearance with upcoming root canal.

## 2023-01-21 ENCOUNTER — Telehealth (HOSPITAL_COMMUNITY): Payer: Self-pay | Admitting: Cardiology

## 2023-01-21 NOTE — Telephone Encounter (Signed)
 Patient called to report PET scan results received directly from Dr Gala Romney however the message was unclear if he should stop amiodarone.   Please advise  -pt aware to follow up in 6 months

## 2023-01-24 NOTE — Telephone Encounter (Signed)
 LMOM

## 2023-01-25 MED ORDER — AMIODARONE HCL 200 MG PO TABS: 200.0000 mg | ORAL_TABLET | Freq: Every day | ORAL | Status: DC

## 2023-01-25 NOTE — Telephone Encounter (Signed)
 Med list updated, mychart mess sent

## 2023-02-22 ENCOUNTER — Other Ambulatory Visit: Payer: Self-pay | Admitting: Home Health

## 2023-02-22 NOTE — Telephone Encounter (Signed)
This is a CHF pt

## 2023-02-25 ENCOUNTER — Ambulatory Visit (INDEPENDENT_AMBULATORY_CARE_PROVIDER_SITE_OTHER): Payer: Medicare HMO

## 2023-02-25 DIAGNOSIS — I4901 Ventricular fibrillation: Secondary | ICD-10-CM

## 2023-02-26 LAB — CUP PACEART REMOTE DEVICE CHECK
Battery Remaining Longevity: 146 mo
Battery Voltage: 3.07 V
Brady Statistic AP VP Percent: 0.02 %
Brady Statistic AP VS Percent: 21.91 %
Brady Statistic AS VP Percent: 0.06 %
Brady Statistic AS VS Percent: 78.01 %
Brady Statistic RA Percent Paced: 23.19 %
Brady Statistic RV Percent Paced: 0.07 %
Date Time Interrogation Session: 20250214052908
HighPow Impedance: 55 Ohm
Implantable Lead Connection Status: 753985
Implantable Lead Connection Status: 753985
Implantable Lead Implant Date: 20241115
Implantable Lead Implant Date: 20241115
Implantable Lead Location: 753859
Implantable Lead Location: 753860
Implantable Lead Model: 5076
Implantable Pulse Generator Implant Date: 20241115
Lead Channel Impedance Value: 247 Ohm
Lead Channel Impedance Value: 342 Ohm
Lead Channel Impedance Value: 494 Ohm
Lead Channel Pacing Threshold Amplitude: 0.625 V
Lead Channel Pacing Threshold Amplitude: 1.5 V
Lead Channel Pacing Threshold Pulse Width: 0.4 ms
Lead Channel Pacing Threshold Pulse Width: 0.4 ms
Lead Channel Sensing Intrinsic Amplitude: 14 mV
Lead Channel Sensing Intrinsic Amplitude: 2.6 mV
Lead Channel Setting Pacing Amplitude: 1.5 V
Lead Channel Setting Pacing Amplitude: 2.25 V
Lead Channel Setting Pacing Pulse Width: 0.4 ms
Lead Channel Setting Sensing Sensitivity: 0.3 mV
Zone Setting Status: 755011
Zone Setting Status: 755011
Zone Setting Status: 755011

## 2023-03-10 NOTE — Progress Notes (Signed)
 Electrophysiology Office Note:   Date:  03/12/2023  ID:  Roy Sullivan, DOB Dec 05, 1950, MRN [id number]  Primary Cardiologist: None Electrophysiologist: [name], MD      History of Present Illness:   Roy Sullivan is a 73 y.o. 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   Discussed the use of AI scribe software for clinical note transcription with the patient, who gave verbal consent to proceed.  History of Present Illness   Patient presents for 3 month follow up after ICD implant. He has occasional palpitations and sensitivity around the device. The patient reports that the sensitivity is only noticeable when the device area is firmly touched. The patient is currently on a reduced dosage of amiodarone and is active, back engaging in regular exercise. The patient's heart rate is noted to be low at baseline. He continues to not drive. No heart failure symptoms. He has no new or acute complaints today.      Review of systems complete and found to be negative unless listed in HPI.   EP Information / Studies Reviewed:    EKG is ordered today. Personal review as below.  EKG Interpretation Date/Time:  Friday March 11 2023 13:55:35 EST Ventricular Rate:  71 PR Interval:  190 QRS Duration:  114 QT Interval:  420 QTC Calculation: 456 R Axis:   16  Text Interpretation: Sinus rhythm with a single PVC Cannot rule out Inferior infarct (cited on or before 27-Nov-2022) When compared with ECG of 27-Nov-2022 06:50,PVC now present. Confirmed by [name] [phone]) on 03/12/2023 2:14:03 PM   Sarcoid PET 01/04/23: FDG uptake findings are inconsistent with active myocardial inflammation/sarcoidosis. There is a resting perfusion defect in the inferolateral wall, which in absence of coronary artery disease could represent scar from sarcoid, but no inflammation seen to suggest active sarcoid    Cardiac MRI 11/24/22: IMPRESSION: 1.  Anterior mediastinal mass noted as above.  Reported on prior CT. 2.  Mildly dilated LV with basal inferolateral akinesis, EF 50%. 3.  Mildly dilated RV with EF 43%. 4. Thinning with near-transmural LGE in the basal to mid inferolateral wall. This is suggestive of prior MI in circumflex territory. However, in the absence of significant coronary disease, it is possible to see transmural/near-transmural LGE with cardiac sarcoidosis. 5. Extracellular volume percentage 34%, this is elevated suggesting increased myocardial fibrotic content.  Echo 11/24/22:  1. Left ventricular ejection fraction, by estimation, is 50 to 55%. The  left ventricle has low normal function. The left ventricle demonstrates  regional wall motion abnormalities (see scoring diagram/findings for  description). The left ventricular  internal cavity size was mildly dilated. Left ventricular diastolic  parameters are indeterminate.   2. Right ventricular systolic function is normal. The right ventricular  size is mildly enlarged. There is normal pulmonary artery systolic  pressure. The estimated right ventricular systolic pressure is 32.4 mmHg.   3. The mitral valve is grossly normal. Mild mitral valve regurgitation.  No evidence of mitral stenosis.   4. The aortic valve is tricuspid. Aortic valve regurgitation is trivial.  No aortic stenosis is present.   5. The inferior vena cava is normal in size with greater than 50%  respiratory variability, suggesting right atrial pressure of 3 mmHg.   LHC 11/23/22: 1.  Widely patent left dominant coronary arteries with minimal plaquing but no obstructive CAD 2.  Mild segmental LV  dysfunction with akinesis of the basal inferior wall, LVEF estimated at 45 to 50% 3.  Normal LVEDP   Physical Exam:   VS:  BP 110/60   Pulse 71   Ht 5\' 9"  (1.753 m)   Wt 187 lb (84.8 kg)   BMI 27.62 kg/m    Wt Readings from Last 3 Encounters:  03/11/23  187 lb (84.8 kg)  11/27/22 180 lb 1.9 oz (81.7 kg)  08/14/18 177 lb (80.3 kg)     GEN: Well nourished, well developed in no acute distress NECK: No JVD CARDIAC: Normal rate, regular rhythm. Well healed left chest pacemaker pocket. RESPIRATORY:  Clear to auscultation without rales, wheezing or rhonchi  ABDOMEN: Soft, non-distended EXTREMITIES:  No edema; No deformity   ASSESSMENT AND PLAN:   Roy Sullivan is a 73 y.o. 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   #. S/p VVI ICD: #. 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 Appropriate device function and stable lead parameters. RV lead threshold fluctuating from 1.0 to 1.75V. Will continue to monitor.  - Patient is able to increase sinus rate during activity. He has some palpitations at night when getting up quickly to go to bathroom. Possibly related to rate response. We will turn rate response off and monitor for any change in symptoms, particularly any limitations with exertion. - 1 episode of NSVT. Single PVC today on 12 lead ECG. No sustained arrhythmias.  - Will discuss with Dr. Gala Romney about stopping amiodarone and replacing with high dose beta-blockade, such as metoprolol XL 50mg  BID. We will then continue to monitor for PVCs and ventricular arrhythmias with his ICD.  - We discussed no driving for 6 months after his arrest.     Follow up with Dr. Jimmey Ralph in 6 months  Signed, [name], MD

## 2023-03-11 ENCOUNTER — Ambulatory Visit: Payer: Medicare HMO | Attending: Cardiology | Admitting: Cardiology

## 2023-03-11 ENCOUNTER — Encounter: Payer: Self-pay | Admitting: Cardiology

## 2023-03-11 VITALS — BP 110/60 | HR 71 | Ht 69.0 in | Wt 187.0 lb

## 2023-03-11 DIAGNOSIS — I472 Ventricular tachycardia, unspecified: Secondary | ICD-10-CM

## 2023-03-11 DIAGNOSIS — Z95810 Presence of automatic (implantable) cardiac defibrillator: Secondary | ICD-10-CM

## 2023-03-11 DIAGNOSIS — I4901 Ventricular fibrillation: Secondary | ICD-10-CM | POA: Diagnosis not present

## 2023-03-11 DIAGNOSIS — I469 Cardiac arrest, cause unspecified: Secondary | ICD-10-CM | POA: Diagnosis not present

## 2023-03-11 NOTE — Patient Instructions (Signed)
Medication Instructions:  Your physician recommends that you continue on your current medications as directed. Please refer to the Current Medication list given to you today.  *If you need a refill on your cardiac medications before your next appointment, please call your pharmacy*  Follow-Up: At [facility], you and your health needs are our priority.  As part of our continuing mission to provide you with exceptional heart care, we have created designated Provider Care Teams.  These Care Teams include your primary Cardiologist (physician) and Advanced Practice Providers (APPs -  Physician Assistants and Nurse Practitioners) who all work together to provide you with the care you need, when you need it.  Your next appointment:   6 months  Provider:   [name], MD

## 2023-03-14 ENCOUNTER — Telehealth: Payer: Self-pay

## 2023-03-14 ENCOUNTER — Encounter: Payer: Self-pay | Admitting: Cardiology

## 2023-03-14 MED ORDER — METOPROLOL SUCCINATE ER 50 MG PO TB24: 50.0000 mg | ORAL_TABLET | Freq: Every day | ORAL | 3 refills | Status: DC

## 2023-03-14 NOTE — Telephone Encounter (Signed)
-----   Message from [name] sent at 03/13/2023  8:52 PM EST ----- Regarding: FW: [employer] patient Can we have patient stop amiodarone and start metoprolol XL 50mg  twice daily?   Thanks, Josh ----- Message ----- From: Dolores Patty, MD Sent: 03/12/2023   3:05 PM EST To: [name], MD Subject: RE: [employer] patient                             Agreed.  This was the plan during his initial hospitalization and agree with it. If he needs an AAD down the road hopefully can avoid Amio as well. Thanks ----- Message ----- From: [name], MD Sent: 03/12/2023   2:26 PM EST To: Dolores Patty, MD Subject: [employer] patient                                 Saw Mr. Biello in clinic yesterday. He seems to be doing well. Occasional PVCs and single NSVT episode on his device. What do you think about stopping his amiodarone and transitioning to 100mg  of metoprolol a day? We will monitor for PVCs, NSVT with his device and go to anti-arrhythmic if needed. Sounds like his PET was not slam dunk for sarcoid and definitively showed no active inflammation. I told him I would discuss with you before making any changes.   Thanks, [employer]

## 2023-03-14 NOTE — Telephone Encounter (Signed)
 Spoke with the patient and advised on recommendations from Dr. Jimmey Ralph. Patient verbalized understanding. Prescription has been sent in.

## 2023-03-15 MED ORDER — METOPROLOL SUCCINATE ER 50 MG PO TB24: 50.0000 mg | ORAL_TABLET | Freq: Two times a day (BID) | ORAL | 3 refills | Status: DC

## 2023-03-16 LAB — CUP PACEART INCLINIC DEVICE CHECK
Date Time Interrogation Session: 20250228160727
Implantable Lead Connection Status: 753985
Implantable Lead Connection Status: 753985
Implantable Lead Implant Date: 20241115
Implantable Lead Implant Date: 20241115
Implantable Lead Location: 753859
Implantable Lead Location: 753860
Implantable Lead Model: 5076
Implantable Pulse Generator Implant Date: 20241115

## 2023-03-21 DIAGNOSIS — N401 Enlarged prostate with lower urinary tract symptoms: Secondary | ICD-10-CM | POA: Diagnosis not present

## 2023-03-21 DIAGNOSIS — R3915 Urgency of urination: Secondary | ICD-10-CM | POA: Diagnosis not present

## 2023-03-21 DIAGNOSIS — N528 Other male erectile dysfunction: Secondary | ICD-10-CM | POA: Diagnosis not present

## 2023-03-21 DIAGNOSIS — R3912 Poor urinary stream: Secondary | ICD-10-CM | POA: Diagnosis not present

## 2023-03-28 NOTE — Progress Notes (Signed)
 Remote ICD transmission.

## 2023-03-28 NOTE — Addendum Note (Signed)
 Addended by: Elease Etienne A on: 03/28/2023 04:16 PM   Modules accepted: Orders

## 2023-05-16 DIAGNOSIS — R3915 Urgency of urination: Secondary | ICD-10-CM | POA: Diagnosis not present

## 2023-05-16 DIAGNOSIS — R3912 Poor urinary stream: Secondary | ICD-10-CM | POA: Diagnosis not present

## 2023-05-16 DIAGNOSIS — N401 Enlarged prostate with lower urinary tract symptoms: Secondary | ICD-10-CM | POA: Diagnosis not present

## 2023-05-27 ENCOUNTER — Ambulatory Visit (INDEPENDENT_AMBULATORY_CARE_PROVIDER_SITE_OTHER): Payer: Medicare HMO

## 2023-05-27 DIAGNOSIS — I469 Cardiac arrest, cause unspecified: Secondary | ICD-10-CM

## 2023-05-27 LAB — CUP PACEART REMOTE DEVICE CHECK
Battery Remaining Longevity: 145 mo
Battery Voltage: 3.03 V
Brady Statistic AP VP Percent: 0.05 %
Brady Statistic AP VS Percent: 12.08 %
Brady Statistic AS VP Percent: 0.06 %
Brady Statistic AS VS Percent: 87.81 %
Brady Statistic RA Percent Paced: 15.26 %
Brady Statistic RV Percent Paced: 0.11 %
Date Time Interrogation Session: 20250515215114
HighPow Impedance: 59 Ohm
Implantable Lead Connection Status: 753985
Implantable Lead Connection Status: 753985
Implantable Lead Implant Date: 20241115
Implantable Lead Implant Date: 20241115
Implantable Lead Location: 753859
Implantable Lead Location: 753860
Implantable Lead Model: 5076
Implantable Pulse Generator Implant Date: 20241115
Lead Channel Impedance Value: 266 Ohm
Lead Channel Impedance Value: 361 Ohm
Lead Channel Impedance Value: 532 Ohm
Lead Channel Pacing Threshold Amplitude: 0.75 V
Lead Channel Pacing Threshold Amplitude: 1.5 V
Lead Channel Pacing Threshold Pulse Width: 0.4 ms
Lead Channel Pacing Threshold Pulse Width: 0.4 ms
Lead Channel Sensing Intrinsic Amplitude: 12.3 mV
Lead Channel Sensing Intrinsic Amplitude: 2.9 mV
Lead Channel Setting Pacing Amplitude: 1.5 V
Lead Channel Setting Pacing Amplitude: 3 V
Lead Channel Setting Pacing Pulse Width: 0.4 ms
Lead Channel Setting Sensing Sensitivity: 0.3 mV
Zone Setting Status: 755011
Zone Setting Status: 755011
Zone Setting Status: 755011

## 2023-06-01 ENCOUNTER — Ambulatory Visit: Payer: Self-pay | Admitting: Cardiology

## 2023-06-06 NOTE — Progress Notes (Signed)
 Advanced Heart Failure Clinic Note  Date:  06/07/2023   ID:  Roy Sullivan, DOB 08/18/1950, MRN [id number]  Location: Home  Provider location:  Advanced Heart Failure Type of Visit: Established patient/post-hospital   PCP:  Roy Star, MD  EP:  Dr. Daneil Sullivan HF Cardiologist: Dr. Julane Sullivan  HPI:  Roy Sullivan is a 73 y.o. male with h/o stable calcified mediastinal granulomas admitted on 11/23/22 with VF arrest during exercise. Received immediate bystander CPR and defibrillation in the field.  Brought to [facility]. Urgent left heart catheterization 11/23/2022 revealed no obstructive CAD; mild segmental LV dysfunction with akinesis of basal inferior wall; LVEF 45-50%; normal LVEDP. 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 ACE level negative  Seen by EP and HF teams. 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   PET 12/24 EF 49% Scar in inferolateral wall felt related to previous sarcoid. No active FDG uptake.   Follow up with EP 2/25, amio stopped and switched to Toprol  XL 50 bid  Today he returns for HF follow up. Overall feeling fine. No SOB with activity. Does an hour of cardio at the gym, feels great. Main complaint is he feels like he is being "paced too much at night." Sometimes interferes with him falling asleep. Occasional positional dizziness, no falls. Denies palpitations, abnormal bleeding, CP, edema, or PND/Orthopnea. Appetite ok. Taking all medications.   Past Medical History:  Diagnosis Date   Dysrhythmia    pt  unaware of diagnosis   History of kidney stones    Renal disorder    Past Surgical History:  Procedure Laterality Date   BACK SURGERY     1989 and1994   CYSTOSCOPY/RETROGRADE/URETEROSCOPY/STONE EXTRACTION WITH BASKET Left 05/01/2012   Procedure: CYSTOSCOPY/LEFT RETROGRADE PYELOGRAM/LEFT URETEROSCOPY/STONE EXTRACTION WITH BASKET;  Surgeon: Edmund Gouge, MD;  Location: [facility];  Service: Urology;  Laterality: Left;      STONE BASKET EXTRACTION POSSIBLE JJ STENT PLACEMENT  C-ARM    EXTRACORPOREAL SHOCK WAVE LITHOTRIPSY Left 08/14/2018   Procedure: EXTRACORPOREAL SHOCK WAVE LITHOTRIPSY (ESWL);  Surgeon: Osborn Blaze, MD;  Location: [facility];  Service: Urology;  Laterality: Left;   HOLMIUM LASER APPLICATION Left 05/01/2012   Procedure: HOLMIUM LASER APPLICATION;  Surgeon: Edmund Gouge, MD;  Location: [facility];  Service: Urology;  Laterality: Left;  FRAGMENTATION    ICD IMPLANT N/A 11/26/2022   Procedure: ICD IMPLANT;  Surgeon: Ardeen Kohler, MD;  [facility] IN[facility]AS[facility] CV LAB;  Service: Cardiovascular;  Laterality: N/A;   LEFT HEART CATH AND CORONARY ANGIOGRAPHY N/A 11/23/2022   Procedure: LEFT HEART CATH AND CORONARY ANGIOGRAPHY;  Surgeon: Arnoldo Lapping, MD;  [facility] IN[facility] CV LAB;  Service: Cardiovascular;  Laterality: N/A;   LUMBAR LAMINECTOMY  1994, 1989   L4-5, S1   STONE EXTRACTION WITH BASKET  2001     Current Outpatient Medications  Medication Sig Dispense Refill   fish oil-omega-3 fatty acids 1000 MG capsule Take 1 g by mouth daily.     metoprolol  succinate (TOPROL  XL) 50 MG 24 hr tablet Take 1 tablet (50 mg total) by mouth in the morning and  at bedtime. Take with or immediately following a meal. 180 tablet 3   Multiple Vitamin (MULITIVITAMIN WITH MINERALS) TABS Take 1 tablet by mouth daily.     potassium citrate (UROCIT-K) 10 MEQ (1080 MG) SR tablet Take 10 mEq by mouth 2 (two) times daily.     rosuvastatin (CRESTOR) 5 MG tablet Take 5 mg by mouth daily.      silodosin  (RAPAFLO ) 8 MG CAPS capsule Take 8 mg by mouth daily with breakfast.     TURMERIC PO Take by mouth daily.     No current facility-administered medications for this encounter.    Allergies:   Patient has no known allergies.   Social History:  The patient  reports that he has never smoked. He has never used smokeless tobacco. He reports current alcohol use. He reports that he does not use drugs.   Family History:  The patient's Family history is unknown by patient.   ROS:  Please see the history of present illness.   All other systems are personally reviewed and negative.   Wt Readings from Last 3 Encounters:  06/07/23 86.3 kg (190 lb 3.2 oz)  03/11/23 84.8 kg (187 lb)  11/27/22 81.7 kg (180 lb 1.9 oz)   BP 110/70   Pulse (!) 45   Wt 86.3 kg (190 lb 3.2 oz)   SpO2 99%   BMI 28.09 kg/m   Physical Exam:  General:  NAD. No resp difficulty, walked into clinic HEENT: Normal Neck: Supple. No JVD. Cor: Irregular rate & rhythm. No rubs, gallops or murmurs. Lungs: Clear Abdomen: Soft, nontender, nondistended.  Extremities: No cyanosis, clubbing, rash, edema Neuro: Alert & oriented x 3, moves all 4 extremities w/o difficulty. Affect pleasant.  ECG (personally reviewed): A paced, 45 bpm  Device interrogation (personally reviewed): OptiVol ok, 7.3 hr/day activity, no VTm <0.1 hr/day AF/AT, 17.1% AP  ASSESSMENT AND PLAN:  1.  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 No active FDG uptake - seen by EP 2/25 and amio stopped and Toprol  XL 50 bid started - Recommended continued PVC surveillance via ICD - With bradycardia and sensation of pacing at night, drop Toprol  XL to 50 mg daily.  2. Anterior mediastinal calcified granulomas - stable by previous CT - Raises suspicion for previous sarcoid - PET scan 12/24 confirmed stability - no change   Signed, Elmarie Hacking, FNP  06/07/2023 11:36 AM  [facility] [address] [facility] [place] [address] [phone] (office) [phone] (fax)  Patient seen and examined with the above-signed Advanced Practice Provider and/or Housestaff. I personally reviewed laboratory data, imaging studies and relevant notes. I independently examined the patient and formulated the important aspects of the plan. I have edited the note to reflect any of my changes or salient points. I have personally discussed the plan with the patient and/or family.  Doing very well. Very active. No recurrent VT. PET scan reviewed personally  and suggests previous sarcoid without current activity.Now off amio.  Has significant pacing burden in setting of low HRs due to his fitness.   General:  Well appearing. No resp difficulty HEENT: normal Neck: supple. no JVD. Carotids 2+ bilat; no bruits. No lymphadenopathy or thryomegaly appreciated. Cor: PMI nondisplaced. Regular rate & rhythm. No rubs, gallops or murmurs. Lungs: clear Abdomen: soft, nontender, nondistended. No hepatosplenomegaly. No bruits or masses. Good bowel sounds. Extremities: no cyanosis, clubbing, rash, edema Neuro: alert & orientedx3, cranial nerves grossly intact. moves all 4 extremities w/o difficulty. Affect pleasant  I reviewed case with Dr. Daneil Sullivan personally on the phone. Suspect VT either scar-mediated or related to R-on-T from PVCs.   Will  drop Toprol  to limit RV pacing. Device interrogated with industry at bedside and back up rate dropped to 40. Still with significant PVC burden which we will continue to monitor through ICD.   Jules Oar, MD  10:48 AM

## 2023-06-07 ENCOUNTER — Ambulatory Visit (HOSPITAL_COMMUNITY)
Admission: RE | Admit: 2023-06-07 | Discharge: 2023-06-07 | Disposition: A | Discharge status: Home / Self Care | Source: Ambulatory Visit | Attending: Internal Medicine | Admitting: Internal Medicine

## 2023-06-07 VITALS — BP 110/70 | HR 45 | Wt 190.2 lb

## 2023-06-07 DIAGNOSIS — R9431 Abnormal electrocardiogram [ECG] [EKG]: Secondary | ICD-10-CM | POA: Insufficient documentation

## 2023-06-07 DIAGNOSIS — I4901 Ventricular fibrillation: Secondary | ICD-10-CM

## 2023-06-07 DIAGNOSIS — I472 Ventricular tachycardia, unspecified: Secondary | ICD-10-CM | POA: Diagnosis not present

## 2023-06-07 DIAGNOSIS — I493 Ventricular premature depolarization: Secondary | ICD-10-CM

## 2023-06-07 DIAGNOSIS — I469 Cardiac arrest, cause unspecified: Secondary | ICD-10-CM

## 2023-06-07 DIAGNOSIS — Z950 Presence of cardiac pacemaker: Secondary | ICD-10-CM | POA: Insufficient documentation

## 2023-06-07 DIAGNOSIS — D8685 Sarcoid myocarditis: Secondary | ICD-10-CM

## 2023-06-07 MED ORDER — METOPROLOL SUCCINATE ER 50 MG PO TB24: 50.0000 mg | ORAL_TABLET | Freq: Every day | ORAL | 3 refills | Status: DC

## 2023-06-07 NOTE — Patient Instructions (Signed)
 Medication Changes:  DECREASE METOPROLOL  SUCCINATE TO 50mg  ONCE DAILY   Follow-Up in: 6 MONTHS PLEASE CALL OUR OFFICE AROUND SEPTEMBER TO GET SCHEDULED FOR YOUR APPOINTMENT. PHONE NUMBER IS [phone] OPTION 2   At the Advanced [facility] Clinic, you and your health needs are our priority. We have a designated team specialized in the treatment of Heart Failure. This Care Team includes your primary Heart Failure Specialized Cardiologist (physician), Advanced Practice Providers (APPs- Physician Assistants and Nurse Practitioners), and Pharmacist who all work together to provide you with the care you need, when you need it.   You may see any of the following providers on your designated Care Team at your next follow up:  Dr. Jules Oar Dr. Peder Bourdon Dr. Alwin Baars Dr. Judyth Nunnery Nieves Bars, NP Ruddy Corral, [place] [facility] [place], [place] Dennise Fitz, NP [place] Lee, NP Luster Salters, PharmD   Please be sure to bring in all your medications bottles to every appointment.   Need to Contact Us :  If you have any questions or concerns before your next appointment please send us  a message through [place] or call our office at [phone].    TO LEAVE A MESSAGE FOR THE NURSE SELECT OPTION 2, PLEASE LEAVE A MESSAGE INCLUDING: YOUR NAME DATE OF BIRTH CALL BACK NUMBER REASON FOR CALL**this is important as we prioritize the call backs  YOU WILL RECEIVE A CALL BACK THE SAME DAY AS LONG AS YOU CALL BEFORE 4:00 PM

## 2023-06-08 ENCOUNTER — Ambulatory Visit (HOSPITAL_COMMUNITY): Payer: Self-pay | Admitting: Family Medicine

## 2023-06-11 DIAGNOSIS — I2510 Atherosclerotic heart disease of native coronary artery without angina pectoris: Secondary | ICD-10-CM | POA: Diagnosis not present

## 2023-06-11 DIAGNOSIS — E7800 Pure hypercholesterolemia, unspecified: Secondary | ICD-10-CM | POA: Diagnosis not present

## 2023-06-14 ENCOUNTER — Telehealth: Payer: Self-pay

## 2023-06-14 ENCOUNTER — Encounter

## 2023-06-14 NOTE — Telephone Encounter (Signed)
 Remote transmission received 06/13/23 at 21:27.  Routing to Dr. Julane Ny to review per request.

## 2023-06-14 NOTE — Telephone Encounter (Signed)
-----   Message from Nurse Daria Eddy B sent at 06/07/2023 12:31 PM EDT ----- Regarding: pvc burden Hi,   We are going to get a PVC burden today on patient- Dr. Julane Ny would like another PVC burden in 2 weeks- could someone ensure that is completed and sent to Dr. Julane Ny?   Thank you  Jenna B. RN

## 2023-07-04 NOTE — Progress Notes (Signed)
 Remote ICD transmission.

## 2023-07-11 DIAGNOSIS — E7800 Pure hypercholesterolemia, unspecified: Secondary | ICD-10-CM | POA: Diagnosis not present

## 2023-07-11 DIAGNOSIS — I2510 Atherosclerotic heart disease of native coronary artery without angina pectoris: Secondary | ICD-10-CM | POA: Diagnosis not present

## 2023-07-13 ENCOUNTER — Telehealth: Payer: Self-pay

## 2023-07-13 NOTE — Telephone Encounter (Signed)
 Alert received from [employer]:  Alert remote transmission: AT/AF Daily Burden>Threshold 2 new AF episodes 6/30 and 7/1, longest duration 11hrs , HR's 94-111, burden 1.76%.  New AF, no OAC   Outreach

## 2023-07-13 NOTE — Telephone Encounter (Signed)
 Spoke with patient regarding new AF alerts. Longest AF episode noted on 6/30 lasting 11 hours 58 minutes. No OAC on MAR. Patient states he was asymptomatic during this time. Follow-up scheduled with Dr. Kennyth on July 10th for recommendations moving forward.

## 2023-07-20 NOTE — Progress Notes (Signed)
 Electrophysiology Office Note:   Date:  07/22/2023  ID:  IHAN PAT, DOB 09/19/1950, MRN [id number]  Primary Cardiologist: None Electrophysiologist: Fonda Kitty, MD     Discussed the use of AI scribe software for clinical note transcription with the patient, who gave verbal consent to proceed.  History of Present Illness Roy Sullivan is a 73 year old male with a history of cardiac arrest and ICD placement who presents with episodes of atrial fibrillation detected by his device.  He has experienced episodes of atrial fibrillation detected by his ICD, with significant episodes on June 30th lasting nearly 12 hours and a 7-minute episode on July 1st. During these episodes, the device alerted him with a sound he described as similar to an 'Amber alert'. He was otherwise unaware of these episodes and denies any associated symptoms.  His current medications include metoprolol , which was recently adjusted. He was previously on a higher dose, which he tolerated well.  He has a history of low heart rates, which are asymptomatic. His lower rate limit has been adjusted to avoid overpacing. He does, however, experience pacing sensations at night, which have improved with recent adjustments.   Review of systems complete and found to be negative unless listed in HPI.   EP Information / Studies Reviewed:    EKG is not ordered today. EKG from 06/07/23 reviewed which showed sinus bradycardia.      Sarcoid PET 01/04/23: FDG uptake findings are inconsistent with active myocardial inflammation/sarcoidosis. There is a resting perfusion defect in the inferolateral wall, which in absence of coronary artery disease could represent scar from sarcoid, but no inflammation seen to suggest active sarcoid   Cardiac MRI 11/24/22: IMPRESSION: 1.  Anterior mediastinal mass noted as above.  Reported on prior CT. 2.  Mildly dilated LV with basal inferolateral akinesis, EF 50%. 3.  Mildly dilated RV with EF  43%. 4. Thinning with near-transmural LGE in the basal to mid inferolateral wall. This is suggestive of prior MI in circumflex territory. However, in the absence of significant coronary disease, it is possible to see transmural/near-transmural LGE with cardiac sarcoidosis. 5. Extracellular volume percentage 34%, this is elevated suggesting increased myocardial fibrotic content.  Echo 11/24/22:  1. Left ventricular ejection fraction, by estimation, is 50 to 55%. The  left ventricle has low normal function. The left ventricle demonstrates  regional wall motion abnormalities (see scoring diagram/findings for  description). The left ventricular  internal cavity size was mildly dilated. Left ventricular diastolic  parameters are indeterminate.   2. Right ventricular systolic function is normal. The right ventricular  size is mildly enlarged. There is normal pulmonary artery systolic  pressure. The estimated right ventricular systolic pressure is 32.4 mmHg.   3. The mitral valve is grossly normal. Mild mitral valve regurgitation.  No evidence of mitral stenosis.   4. The aortic valve is tricuspid. Aortic valve regurgitation is trivial.  No aortic stenosis is present.   5. The inferior vena cava is normal in size with greater than 50%  respiratory variability, suggesting right atrial pressure of 3 mmHg.   LHC 11/23/22: 1.  Widely patent left dominant coronary arteries with minimal plaquing but no obstructive CAD 2.  Mild segmental LV dysfunction with akinesis of the basal inferior wall, LVEF estimated at 45 to 50% 3.  Normal LVEDP   Physical Exam:   VS:  BP 102/60   Pulse (!) 57   Ht 5' 9 (1.753 m)   Wt 185 lb (83.9 kg)  SpO2 95%   BMI 27.32 kg/m    Wt Readings from Last 3 Encounters:  07/21/23 185 lb (83.9 kg)  06/07/23 190 lb 3.2 oz (86.3 kg)  03/11/23 187 lb (84.8 kg)     GEN: Well nourished, well developed in no acute distress NECK: No JVD CARDIAC: Bradycardic, regular  rhythm. Well healed left chest pacemaker pocket. RESPIRATORY:  Clear to auscultation without rales, wheezing or rhonchi  ABDOMEN: Soft, non-distended EXTREMITIES:  No edema; No deformity   ASSESSMENT AND PLAN:   Roy Sullivan is a 73 y.o. 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   #. Device detected AF, paroxsymal AF: Longest episode 11 hours and 58 minutes. Asymptomatic. #. Hypercoagulable state due to AF: CHADSVASC score of 1.  - Long discussion regarding risks and benefits of anticoagulation for stroke prophylaxis in the setting of subclinical AF/device detected AF less than 24 hours and a CHA2DS2-VASc score of 1.  No clear evidence that benefits of anticoagulation would outweigh risks at this time.  We will continue to closely monitor and if AF burden increases readdress potential anticoagulation. - Continue to monitor overall burden with device.  If burden increases or he becomes symptomatic, then he would be appropriate candidate for catheter ablation. - Increase metoprolol  XL back to 50 mg twice daily.  #. S/p VVI ICD: #. 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 - In clinic device interrogation performed today. Appropriate device function and stable lead parameters. RV lead threshold has fluctuated from 1.0 to 1.75V but does not appear to be worsening. Excellent sensing. Will continue to monitor.  -Continue remote monitoring. -Monitor AF burden through his device as mentioned above.    Follow up with Dr. Kennyth in 3 months  Signed, Fonda Kennyth, MD

## 2023-07-21 ENCOUNTER — Encounter: Payer: Self-pay | Admitting: Cardiology

## 2023-07-21 ENCOUNTER — Ambulatory Visit: Attending: Cardiology | Admitting: Cardiology

## 2023-07-21 VITALS — BP 102/60 | HR 57 | Ht 69.0 in | Wt 185.0 lb

## 2023-07-21 DIAGNOSIS — I469 Cardiac arrest, cause unspecified: Secondary | ICD-10-CM

## 2023-07-21 DIAGNOSIS — D6869 Other thrombophilia: Secondary | ICD-10-CM | POA: Diagnosis not present

## 2023-07-21 DIAGNOSIS — Z95810 Presence of automatic (implantable) cardiac defibrillator: Secondary | ICD-10-CM

## 2023-07-21 DIAGNOSIS — I480 Paroxysmal atrial fibrillation: Secondary | ICD-10-CM

## 2023-07-21 LAB — CUP PACEART INCLINIC DEVICE CHECK
Date Time Interrogation Session: 20250710091030
Implantable Lead Connection Status: 753985
Implantable Lead Connection Status: 753985
Implantable Lead Implant Date: 20241115
Implantable Lead Implant Date: 20241115
Implantable Lead Location: 753859
Implantable Lead Location: 753860
Implantable Lead Model: 5076
Implantable Pulse Generator Implant Date: 20241115

## 2023-07-21 MED ORDER — METOPROLOL SUCCINATE ER 50 MG PO TB24: 50.0000 mg | ORAL_TABLET | Freq: Two times a day (BID) | ORAL | 3 refills | Status: AC

## 2023-07-21 NOTE — Patient Instructions (Signed)
 Medication Instructions:  Your physician has recommended you make the following change in your medication:  1) INCREASE Toprol  XL (metoprolol  succinate) to mg twice daily *If you need a refill on your cardiac medications before your next appointment, please call your pharmacy*  Follow-Up: At [facility], you and your health needs are our priority.  As part of our continuing mission to provide you with exceptional heart care, our providers are all part of one team.  This team includes your primary Cardiologist (physician) and Advanced Practice Providers or APPs (Physician Assistants and Nurse Practitioners) who all work together to provide you with the care you need, when you need it.  Your next appointment:   6 months  Provider:   Fonda Kitty, MD

## 2023-07-31 ENCOUNTER — Ambulatory Visit: Payer: Self-pay | Admitting: Cardiology

## 2023-08-11 DIAGNOSIS — E7800 Pure hypercholesterolemia, unspecified: Secondary | ICD-10-CM | POA: Diagnosis not present

## 2023-08-11 DIAGNOSIS — M25512 Pain in left shoulder: Secondary | ICD-10-CM | POA: Diagnosis not present

## 2023-08-11 DIAGNOSIS — I2510 Atherosclerotic heart disease of native coronary artery without angina pectoris: Secondary | ICD-10-CM | POA: Diagnosis not present

## 2023-08-24 DIAGNOSIS — N200 Calculus of kidney: Secondary | ICD-10-CM | POA: Diagnosis not present

## 2023-08-24 DIAGNOSIS — N401 Enlarged prostate with lower urinary tract symptoms: Secondary | ICD-10-CM | POA: Diagnosis not present

## 2023-08-26 ENCOUNTER — Ambulatory Visit (INDEPENDENT_AMBULATORY_CARE_PROVIDER_SITE_OTHER): Payer: Medicare HMO

## 2023-08-26 DIAGNOSIS — I469 Cardiac arrest, cause unspecified: Secondary | ICD-10-CM

## 2023-08-30 LAB — CUP PACEART REMOTE DEVICE CHECK
Battery Remaining Longevity: 141 mo
Battery Voltage: 3.02 V
Brady Statistic AP VP Percent: 0.05 %
Brady Statistic AP VS Percent: 6.67 %
Brady Statistic AS VP Percent: 0.05 %
Brady Statistic AS VS Percent: 93.22 %
Brady Statistic RA Percent Paced: 9.67 %
Brady Statistic RV Percent Paced: 0.1 %
Date Time Interrogation Session: 20250814203214
HighPow Impedance: 56 Ohm
Implantable Lead Connection Status: 753985
Implantable Lead Connection Status: 753985
Implantable Lead Implant Date: 20241115
Implantable Lead Implant Date: 20241115
Implantable Lead Location: 753859
Implantable Lead Location: 753860
Implantable Lead Model: 5076
Implantable Pulse Generator Implant Date: 20241115
Lead Channel Impedance Value: 247 Ohm
Lead Channel Impedance Value: 323 Ohm
Lead Channel Impedance Value: 418 Ohm
Lead Channel Pacing Threshold Amplitude: 0.5 V
Lead Channel Pacing Threshold Amplitude: 1.375 V
Lead Channel Pacing Threshold Pulse Width: 0.4 ms
Lead Channel Pacing Threshold Pulse Width: 0.4 ms
Lead Channel Sensing Intrinsic Amplitude: 11 mV
Lead Channel Sensing Intrinsic Amplitude: 2.6 mV
Lead Channel Setting Pacing Amplitude: 1.5 V
Lead Channel Setting Pacing Amplitude: 3 V
Lead Channel Setting Pacing Pulse Width: 0.6 ms
Lead Channel Setting Sensing Sensitivity: 0.3 mV
Zone Setting Status: 755011
Zone Setting Status: 755011
Zone Setting Status: 755011

## 2023-08-31 ENCOUNTER — Encounter (HOSPITAL_COMMUNITY): Payer: Self-pay

## 2023-09-05 ENCOUNTER — Ambulatory Visit: Payer: Self-pay | Admitting: Cardiology

## 2023-09-11 DIAGNOSIS — E7800 Pure hypercholesterolemia, unspecified: Secondary | ICD-10-CM | POA: Diagnosis not present

## 2023-09-11 DIAGNOSIS — I2510 Atherosclerotic heart disease of native coronary artery without angina pectoris: Secondary | ICD-10-CM | POA: Diagnosis not present

## 2023-09-12 DIAGNOSIS — N200 Calculus of kidney: Secondary | ICD-10-CM | POA: Diagnosis not present

## 2023-10-05 NOTE — Progress Notes (Signed)
Remote ICD Transmission.

## 2023-10-11 DIAGNOSIS — I2510 Atherosclerotic heart disease of native coronary artery without angina pectoris: Secondary | ICD-10-CM | POA: Diagnosis not present

## 2023-10-11 DIAGNOSIS — E7800 Pure hypercholesterolemia, unspecified: Secondary | ICD-10-CM | POA: Diagnosis not present

## 2023-10-30 NOTE — Progress Notes (Unsigned)
 Electrophysiology Office Note:   Date:  11/01/2023  ID:  Roy Sullivan, DOB 25-Nov-1950, MRN [id number]  Primary Cardiologist: None Primary Heart Failure: None Electrophysiologist: Fonda Kitty, MD       History of Present Illness:   Roy Sullivan is a 73 y.o. male with h/o cardiac arrest s/p ICD, AF seen today for routine electrophysiology followup.   Seen by Dr. Kitty in 07/2023 with newly alerted AF on device.   Since last being seen in our clinic the patient reports no awareness of AF.  He does state that he was at the [place]  football game on Saturday 10/18 and noted a clicking sensation in his chest that lasted for approximately 3 to 4 minutes.  He notes that this occurred again last evening around 10:15 PM and it lasted for approximately a minute and 1 minute.  He states it is not painful but he does describe it as a clicking sensation.  Denies feeling as if it is an electronic hiccup or kicking from the inside.  He specifically denies pain with the sensation, chest pressure, associated nausea or diaphoresis.  He denies chest pain, palpitations, dyspnea, PND, orthopnea, nausea, vomiting, dizziness, syncope, edema, weight gain, or early satiety.   Review of systems complete and found to be negative unless listed in HPI.   EP Information / Studies Reviewed:    EKG is not ordered today. EKG from 06/07/23 reviewed which showed AP 45 bpm      ICD Interrogation-  reviewed in detail today,  See PACEART report.  Device History: Field seismologist ICD implanted 11/26/22 for Cardiac Arrest History of appropriate therapy: No History of AAD therapy: No   Risk Assessment/Calculations:    CHA2DS2-VASc Score = 1   This indicates a 0.6% annual risk of stroke. The patient's score is based upon: CHF History: 0 HTN History: 0 Diabetes History: 0 Stroke History: 0 Vascular Disease History: 0 Age Score: 1 Gender Score: 0              Physical Exam:   VS:  BP  103/62 (BP Location: Left Arm, Patient Position: Sitting, Cuff Size: Normal)   Pulse (!) 55   Ht 5' 9 (1.753 m)   Wt 193 lb 6.4 oz (87.7 kg)   SpO2 94%   BMI 28.56 kg/m    Wt Readings from Last 3 Encounters:  11/01/23 193 lb 6.4 oz (87.7 kg)  07/21/23 185 lb (83.9 kg)  06/07/23 190 lb 3.2 oz (86.3 kg)     GEN: Well nourished, well developed in no acute distress NECK: No JVD; No carotid bruits CARDIAC: Regular rate and rhythm, no murmurs, rubs, gallops RESPIRATORY:  Clear to auscultation without rales, wheezing or rhonchi  ABDOMEN: Soft, non-tender, non-distended EXTREMITIES:  No edema; No deformity   ASSESSMENT AND PLAN:    VF Cardiac Arrest  s/p [employer] dual chamber ICD  PVC's  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  Low burden of AF.  Impedances stable, sensing stable with 3.3 mV in the atrium, 13 mV (tip to ring) and 9.6 mV (tip to coil) in the ventricle.  Atrial threshold testing 0.5 V at 0.4 ms.  Ventricular threshold testing 1.5 V at 0.4 ms > stable but does vacillate some over time.  Upon initiating  atrial threshold testing, the patient stated immediately oh, that's it.  During that time he was APVP.  On ventricular threshold testing he reported a similar sensation but only noted it for 5-6 beats.  Isolated the atrium with AAI at high output and the patient did not have unawareness or similar sensation.  Upon VVI testing with high output, the patient immediately was aware when pacing began and when it ended.  This was similar when testing tip to ring and also tip to coil.  The patient does not frequently V-paced (less than 0.1%).  I am not sure if this is an awareness of pacing given the fact that he does not pace frequently or if this represents possible  microperforation.  Reviewed with Dr. Kennyth.   -assess ECHO for lead placement  -assess CXR  -close follow-up with Dr. Kennyth DOPP / Device Detected AF  CHA2DS2-VASc 1  -prior discussion regarding AF / risk score, pt not on OAC given low burden / asymptomatic  -continue Toprol  50mg  BID  -only 1 min 42 sec AF on device     Disposition:   Follow up with Dr. Kennyth on 11/25/23 > scheduler notified of appt need / availability    Signed, Daphne Barrack, NP-C, AGACNP-BC [facility] - Electrophysiology  11/01/2023, 5:37 PM

## 2023-11-01 ENCOUNTER — Encounter: Payer: Self-pay | Admitting: Pulmonary Disease

## 2023-11-01 ENCOUNTER — Ambulatory Visit: Admitting: Cardiology

## 2023-11-01 ENCOUNTER — Ambulatory Visit: Attending: Pulmonary Disease | Admitting: Pulmonary Disease

## 2023-11-01 VITALS — BP 103/62 | HR 55 | Ht 69.0 in | Wt 193.4 lb

## 2023-11-01 DIAGNOSIS — I493 Ventricular premature depolarization: Secondary | ICD-10-CM | POA: Diagnosis not present

## 2023-11-01 DIAGNOSIS — I480 Paroxysmal atrial fibrillation: Secondary | ICD-10-CM

## 2023-11-01 DIAGNOSIS — Z95810 Presence of automatic (implantable) cardiac defibrillator: Secondary | ICD-10-CM

## 2023-11-01 DIAGNOSIS — I469 Cardiac arrest, cause unspecified: Secondary | ICD-10-CM | POA: Diagnosis not present

## 2023-11-01 DIAGNOSIS — I472 Ventricular tachycardia, unspecified: Secondary | ICD-10-CM | POA: Diagnosis not present

## 2023-11-01 LAB — CUP PACEART INCLINIC DEVICE CHECK
Date Time Interrogation Session: 20251021172747
Implantable Lead Connection Status: 753985
Implantable Lead Connection Status: 753985
Implantable Lead Implant Date: 20241115
Implantable Lead Implant Date: 20241115
Implantable Lead Location: 753859
Implantable Lead Location: 753860
Implantable Lead Model: 5076
Implantable Pulse Generator Implant Date: 20241115

## 2023-11-01 NOTE — Patient Instructions (Addendum)
 Medication Instructions:  Your physician recommends that you continue on your current medications as directed. Please refer to the Current Medication list given to you today.  *If you need a refill on your cardiac medications before your next appointment, please call your pharmacy*  Lab Work: None ordered If you have labs (blood work) drawn today and your tests are completely normal, you will receive your results only by: MyChart Message (if you have MyChart) OR A paper copy in the mail If you have any lab test that is abnormal or we need to change your treatment, we will call you to review the results.  Testing/Procedures: Your physician has requested that you have an echocardiogram. You will receive a call from our office to schedule this. Echocardiography is a painless test that uses sound waves to create images of your heart. It provides your doctor with information about the size and shape of your heart and how well your heart's chambers and valves are working. This procedure takes approximately one hour. There are no restrictions for this procedure. Please do NOT wear cologne, perfume, aftershave, or lotions (deodorant is allowed). Please arrive 15 minutes prior to your appointment time.  Please note: We ask at that you not bring children with you during ultrasound (echo/ vascular) testing. Due to room size and safety concerns, children are not allowed in the ultrasound rooms during exams. Our front office staff cannot provide observation of children in our lobby area while testing is being conducted. An adult accompanying a patient to their appointment will only be allowed in the ultrasound room at the discretion of the ultrasound technician under special circumstances. We apologize for any inconvenience.   Your provider has requested that you have a chest x-ray. A chest x-ray takes a picture of the organs and structures inside the chest, including the heart, lungs, and blood vessels. This  test can show several things, including, whether the heart is enlarges; whether fluid is building up in the lungs; and whether pacemaker / defibrillator leads are still in place. Come to this building and check in on the 1st floor for this and they will direct you from there. You do not need an appointment.  Follow-Up: At [facility], you and your health needs are our priority.  As part of our continuing mission to provide you with exceptional heart care, our providers are all part of one team.  This team includes your primary Cardiologist (physician) and Advanced Practice Providers or APPs (Physician Assistants and Nurse Practitioners) who all work together to provide you with the care you need, when you need it.  Your next appointment:   Follow up will be based on results of testing.

## 2023-11-02 ENCOUNTER — Ambulatory Visit (HOSPITAL_COMMUNITY)
Admission: RE | Admit: 2023-11-02 | Discharge: 2023-11-02 | Disposition: A | Discharge status: Home / Self Care | Source: Ambulatory Visit | Attending: Cardiology | Admitting: Cardiology

## 2023-11-02 DIAGNOSIS — I472 Ventricular tachycardia, unspecified: Secondary | ICD-10-CM | POA: Insufficient documentation

## 2023-11-02 DIAGNOSIS — Z95810 Presence of automatic (implantable) cardiac defibrillator: Secondary | ICD-10-CM | POA: Insufficient documentation

## 2023-11-02 DIAGNOSIS — I469 Cardiac arrest, cause unspecified: Secondary | ICD-10-CM | POA: Insufficient documentation

## 2023-11-04 ENCOUNTER — Ambulatory Visit (HOSPITAL_COMMUNITY)
Admission: RE | Admit: 2023-11-04 | Discharge: 2023-11-04 | Disposition: A | Discharge status: Home / Self Care | Source: Ambulatory Visit | Attending: Vascular Surgery | Admitting: Vascular Surgery

## 2023-11-04 DIAGNOSIS — Z95810 Presence of automatic (implantable) cardiac defibrillator: Secondary | ICD-10-CM | POA: Diagnosis not present

## 2023-11-04 DIAGNOSIS — N200 Calculus of kidney: Secondary | ICD-10-CM | POA: Diagnosis not present

## 2023-11-04 DIAGNOSIS — I517 Cardiomegaly: Secondary | ICD-10-CM | POA: Diagnosis not present

## 2023-11-04 DIAGNOSIS — I472 Ventricular tachycardia, unspecified: Secondary | ICD-10-CM | POA: Insufficient documentation

## 2023-11-04 DIAGNOSIS — I469 Cardiac arrest, cause unspecified: Secondary | ICD-10-CM | POA: Diagnosis not present

## 2023-11-04 LAB — ECHOCARDIOGRAM COMPLETE
AR max vel: 3 cm2
AV Area VTI: 2.82 cm2
AV Area mean vel: 2.97 cm2
AV Mean grad: 4 mmHg
AV Peak grad: 6.7 mmHg
Ao pk vel: 1.29 m/s
Area-P 1/2: 3.42 cm2
S' Lateral: 4.68 cm

## 2023-11-05 ENCOUNTER — Ambulatory Visit: Payer: Self-pay | Admitting: Cardiology

## 2023-11-11 DIAGNOSIS — I2510 Atherosclerotic heart disease of native coronary artery without angina pectoris: Secondary | ICD-10-CM | POA: Diagnosis not present

## 2023-11-11 DIAGNOSIS — E7800 Pure hypercholesterolemia, unspecified: Secondary | ICD-10-CM | POA: Diagnosis not present

## 2023-11-15 ENCOUNTER — Encounter: Payer: Self-pay | Admitting: Cardiology

## 2023-11-15 ENCOUNTER — Ambulatory Visit: Attending: Cardiology | Admitting: Cardiology

## 2023-11-15 VITALS — BP 111/68 | HR 71 | Ht 69.0 in | Wt 185.0 lb

## 2023-11-15 DIAGNOSIS — I480 Paroxysmal atrial fibrillation: Secondary | ICD-10-CM

## 2023-11-15 DIAGNOSIS — D6869 Other thrombophilia: Secondary | ICD-10-CM

## 2023-11-15 DIAGNOSIS — Z95810 Presence of automatic (implantable) cardiac defibrillator: Secondary | ICD-10-CM

## 2023-11-15 DIAGNOSIS — I469 Cardiac arrest, cause unspecified: Secondary | ICD-10-CM | POA: Diagnosis not present

## 2023-11-15 DIAGNOSIS — I493 Ventricular premature depolarization: Secondary | ICD-10-CM

## 2023-11-15 LAB — CUP PACEART INCLINIC DEVICE CHECK
Date Time Interrogation Session: 20251104170514
Implantable Lead Connection Status: 753985
Implantable Lead Connection Status: 753985
Implantable Lead Implant Date: 20241115
Implantable Lead Implant Date: 20241115
Implantable Lead Location: 753859
Implantable Lead Location: 753860
Implantable Lead Model: 5076
Implantable Pulse Generator Implant Date: 20241115

## 2023-11-15 NOTE — Progress Notes (Signed)
 Electrophysiology Office Note:   Date:  11/20/2023  ID:  MCDANIEL OHMS, DOB [birth year], MRN [id number]  Primary Cardiologist: None Electrophysiologist: Fonda Kitty, MD      History of Present Illness Roy Sullivan is a 73 year old male with a history of cardiac arrest and ICD placement who presents for follow-up evaluation.    Patient recently seen by Daphne Barrack, NP after having atrial fibrillation detected by his device.  During that visit he endorsed feeling a clicking sensation in his chest on occasion, recently during a [place]  football game.  Device was tested in clinic and stated he felt a similar sensation during pacing.  Patient had chest x-ray and echocardiogram which did not show any obvious abnormality with his device.  He presents today for follow-up discussion.  He states that he does not have this clicking sensation every day, but on occasion.  It is not bothersome to him or painful.  He is unable to describe it further.  He reports that he is otherwise well with no new or acute complaints.  He denies any chest pain.  He continues to be very active.   Review of systems complete and found to be negative unless listed in HPI.   EP Information / Studies Reviewed:    EKG is ordered today. Personal review as below.  EKG Interpretation Date/Time:  Tuesday November 15 2023 15:38:53 EST Ventricular Rate:  71 PR Interval:  192 QRS Duration:  90 QT Interval:  398 QTC Calculation: 432 R Axis:   67  Text Interpretation: Atrial-paced rhythm with frequent Premature ventricular complexes Low voltage QRS T wave abnormality, consider inferior ischemia When compared with ECG of 07-Jun-2023 11:49, Premature ventricular complexes are now Present Vent. rate has increased BY  26 BPM Criteria for Septal infarct are no longer Present T wave inversion now evident in Inferior leads Nonspecific T wave abnormality now evident in Lateral leads Confirmed by Kitty Fonda [phone]) on 11/20/2023  11:41:27 AM   Sarcoid PET 01/04/23: FDG uptake findings are inconsistent with active myocardial inflammation/sarcoidosis. There is a resting perfusion defect in the inferolateral wall, which in absence of coronary artery disease could represent scar from sarcoid, but no inflammation seen to suggest active sarcoid   Cardiac MRI 11/24/22: IMPRESSION: 1.  Anterior mediastinal mass noted as above.  Reported on prior CT. 2.  Mildly dilated LV with basal inferolateral akinesis, EF 50%. 3.  Mildly dilated RV with EF 43%. 4. Thinning with near-transmural LGE in the basal to mid inferolateral wall. This is suggestive of prior MI in circumflex territory. However, in the absence of significant coronary disease, it is possible to see transmural/near-transmural LGE with cardiac sarcoidosis. 5. Extracellular volume percentage 34%, this is elevated suggesting increased myocardial fibrotic content.  Echo 11/04/23  1. Left ventricular ejection fraction, by estimation, is 45 to 50%. Left  ventricular ejection fraction by 3D volume is 49 %. The left ventricle has  mildly decreased function. The left ventricle has no regional wall motion  abnormalities. Left ventricular   diastolic parameters are consistent with Grade II diastolic dysfunction  (pseudonormalization). The average left ventricular global longitudinal  strain is -19.3 %. The global longitudinal strain is normal.   2. Right ventricular systolic function is normal. The right ventricular  size is normal. There is normal pulmonary artery systolic pressure.   3. Left atrial size was severely dilated.   4. The mitral valve is normal in structure. Trivial mitral valve  regurgitation. No evidence of mitral  stenosis.   5. The aortic valve was not well visualized. Aortic valve regurgitation  is not visualized. No aortic stenosis is present.   6. The inferior vena cava is normal in size with greater than 50%  respiratory variability, suggesting right  atrial pressure of 3 mmHg.    LHC 11/23/22: 1.  Widely patent left dominant coronary arteries with minimal plaquing but no obstructive CAD 2.  Mild segmental LV dysfunction with akinesis of the basal inferior wall, LVEF estimated at 45 to 50% 3.  Normal LVEDP   Physical Exam:   VS:  BP 111/68   Pulse 71   Ht 5' 9 (1.753 m)   Wt 185 lb (83.9 kg)   SpO2 95%   BMI 27.32 kg/m    Wt Readings from Last 3 Encounters:  11/15/23 185 lb (83.9 kg)  11/01/23 193 lb 6.4 oz (87.7 kg)  07/21/23 185 lb (83.9 kg)     GEN: Well nourished, well developed in no acute distress NECK: No JVD CARDIAC: Bradycardic, regular rhythm. Well healed left chest ICD pocket. RESPIRATORY:  Clear to auscultation without rales, wheezing or rhonchi  ABDOMEN: Soft, non-distended EXTREMITIES:  No edema; No deformity   ASSESSMENT AND PLAN:   Roy Sullivan is a 73 y.o. 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   There was some concern that he might of had microperforation of his RV lead given presence of a clicking sensation in the chest that he feels on occasion could be related to ventricular pacing.  He has minimal V pacing burden.  Interestingly, he does not feel this occasion daily despite his device running daily capture assessments.  When testing his ventricular lead today, he had no clicking sensation during pacing.  Chest x-ray shows stable position of both RA and RV leads.  Echocardiogram does not show any significant pericardial effusion or obvious perforation.  I am not sure if this clicking sensation is related to V pacing at all given inconsistency of symptoms.  We discussed risk and benefits of repositioning the RV lead without definitive evidence that it could be contributory and we elected to continue to monitor for now.  He is having an increase in his PVC burden, and I wonder if some of the  sensation that he feels at times are from PVCs.  #. S/p VVI ICD: #. 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 - In clinic device interrogation performed today. Appropriate device function and stable lead parameters. RV lead threshold has fluctuated from 1.0 to 1.75V but does not appear to be worsening. Excellent sensing. Will continue to monitor.  -Continue remote monitoring.  #. PVCs: Question if increasing burden could be the etiology of his symptoms.  PVCs appear multifocal. - Previously suppressed with amiodarone  although this has been discontinued due to concerns over long-term risks.  Currently on metoprolol .  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  He had prior cardiac PET without active inflammation.  Will discuss with Dr. Bensimhon, with whom he has an upcoming appointment, whether it is worth repeating given uptick in his ectopy.  #. Device detected AF, paroxsymal AF: Longest episode 11 hours and 58 minutes. Asymptomatic. #. Hypercoagulable state due to AF: CHADSVASC score of 1.  - Long discussion regarding risks and benefits of anticoagulation for stroke prophylaxis in the setting of subclinical  AF/device detected AF less than 24 hours and a CHA2DS2-VASc score of 1 has been done previously.  No clear evidence that benefits of anticoagulation would outweigh risks at this time.  We will continue to closely monitor and if AF burden increases readdress potential anticoagulation. - Continue to monitor overall burden with device.  If burden increases or he becomes symptomatic, then he would be appropriate candidate for catheter ablation. - Continue metoprolol  XL 50 mg twice daily.    Follow up with Dr. Kennyth in 6 months  Signed, Fonda Kennyth, MD

## 2023-11-15 NOTE — Patient Instructions (Signed)
 Medication Instructions:  Your physician recommends that you continue on your current medications as directed. Please refer to the Current Medication list given to you today.  *If you need a refill on your cardiac medications before your next appointment, please call your pharmacy*  Follow-Up: At [facility], you and your health needs are our priority.  As part of our continuing mission to provide you with exceptional heart care, our providers are all part of one team.  This team includes your primary Cardiologist (physician) and Advanced Practice Providers or APPs (Physician Assistants and Nurse Practitioners) who all work together to provide you with the care you need, when you need it.  Your next appointment:   6 months  Provider:   Ardeen Kohler, MD

## 2023-11-16 ENCOUNTER — Ambulatory Visit: Payer: Self-pay | Admitting: Cardiology

## 2023-11-20 NOTE — Progress Notes (Signed)
 [facility] Clinic Note  Date:  11/20/2023   ID:  Roy Sullivan, DOB [birth year], MRN [id number]  Location: Home  Provider location: [place] [facility] Type of Visit: Established patient/post-hospital   PCP:  Rexanne Ingle, MD  EP:  Dr. Kennyth HF Cardiologist: Dr. Cherrie  HPI:  Roy Sullivan is a 73 y.o. male with h/o stable calcified mediastinal granulomas admitted on 11/23/22 with VF arrest during exercise. Received immediate bystander CPR and defibrillation in the field.  Brought to [facility]. Urgent left heart catheterization 11/23/2022 revealed no obstructive CAD; mild segmental LV dysfunction with akinesis of basal inferior wall; LVEF 45-50%; normal LVEDP. 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 ACE level negative  Seen by EP and HF teams. 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   PET 12/24 EF 49% Scar in inferolateral wall felt related to previous sarcoid. No active FDG uptake.   Follow up with EP 2/25, amio stopped and switched to Toprol  XL 50 bid  Seen by EP Anice) last week due to recent AF discovered on ICD (longest 11hr 58 mins) as well as clicking sensation in chest. Initially felt it may be related to RV lead and RV pacing but testing did not support this. Also found to have increased PVC burden  Today he returns for HF follow up. Overall feeling fine. No SOB with activity. Does an hour of cardio at the gym, feels great. Main complaint is he feels like he is being paced too much at  night. Sometimes interferes with him falling asleep. Occasional positional dizziness, no falls. Denies palpitations, abnormal bleeding, CP, edema, or PND/Orthopnea. Appetite ok. Taking all medications.   Past Medical History:  Diagnosis Date   Dysrhythmia    pt unaware of diagnosis   History of kidney stones    Renal disorder    Past Surgical History:  Procedure Laterality Date   BACK SURGERY     1989 and1994   CYSTOSCOPY/RETROGRADE/URETEROSCOPY/STONE EXTRACTION WITH BASKET Left 05/01/2012   Procedure: CYSTOSCOPY/LEFT RETROGRADE PYELOGRAM/LEFT URETEROSCOPY/STONE EXTRACTION WITH BASKET;  Surgeon: Arlena LILLETTE Gal, MD;  Location: [facility];  Service: Urology;  Laterality: Left;      STONE BASKET EXTRACTION POSSIBLE JJ STENT PLACEMENT  C-ARM    EXTRACORPOREAL SHOCK WAVE LITHOTRIPSY Left 08/14/2018   Procedure: EXTRACORPOREAL SHOCK WAVE LITHOTRIPSY (ESWL);  Surgeon: Alvaro Hummer, MD;  Location: [facility];  Service: Urology;  Laterality: Left;   HOLMIUM LASER APPLICATION Left 05/01/2012   Procedure: HOLMIUM LASER APPLICATION;  Surgeon: Arlena LILLETTE Gal, MD;  Location: [facility];  Service: Urology;  Laterality: Left;  FRAGMENTATION    ICD IMPLANT N/A 11/26/2022   Procedure: ICD IMPLANT;  Surgeon: Kennyth Chew, MD;  Location: [facility] [place] [facility] CV LAB;  Service: Cardiovascular;  Laterality: N/A;   LEFT HEART CATH AND CORONARY ANGIOGRAPHY N/A 11/23/2022   Procedure: LEFT HEART CATH AND CORONARY ANGIOGRAPHY;  Surgeon: Wonda Sharper, MD;  Location: [facility] INVASIVE CV LAB;  Service: Cardiovascular;  Laterality: N/A;   LUMBAR LAMINECTOMY  1994, 1989   L4-5, S1   STONE EXTRACTION WITH BASKET  2001  Current Outpatient Medications  Medication Sig Dispense Refill   fish oil-omega-3 fatty acids 1000 MG capsule Take 1 g by mouth daily.     metoprolol  succinate (TOPROL -XL) 50 MG 24 hr tablet Take 1 tablet (50 mg total) by mouth in the morning and at bedtime. Take with or immediately following a  meal. 90 tablet 3   Multiple Vitamin (MULITIVITAMIN WITH MINERALS) TABS Take 1 tablet by mouth daily.     potassium citrate (UROCIT-K) 10 MEQ (1080 MG) SR tablet Take 10 mEq by mouth 2 (two) times daily.     rosuvastatin (CRESTOR) 5 MG tablet Take 5 mg by mouth daily.     silodosin  (RAPAFLO ) 8 MG CAPS capsule Take 8 mg by mouth daily with breakfast.     TURMERIC PO Take by mouth daily.     No current facility-administered medications for this encounter.    Allergies:   Patient has no known allergies.   Social History:  The patient  reports that he has never smoked. He has never used smokeless tobacco. He reports current alcohol use. He reports that he does not use drugs.   Family History:  The patient's Family history is unknown by patient.   ROS:  Please see the history of present illness.   All other systems are personally reviewed and negative.   Wt Readings from Last 3 Encounters:  11/15/23 83.9 kg (185 lb)  11/01/23 87.7 kg (193 lb 6.4 oz)  07/21/23 83.9 kg (185 lb)   There were no vitals taken for this visit.  Physical Exam:  General:  NAD. No resp difficulty, walked into clinic HEENT: Normal Neck: Supple. No JVD. Cor: Irregular rate & rhythm. No rubs, gallops or murmurs. Lungs: Clear Abdomen: Soft, nontender, nondistended.  Extremities: No cyanosis, clubbing, rash, edema Neuro: Alert & oriented x 3, moves all 4 extremities w/o difficulty. Affect pleasant.  ECG (personally reviewed): A paced, 45 bpm  Device interrogation (personally reviewed): OptiVol ok, 7.3 hr/day activity, no VTm <0.1 hr/day AF/AT, 17.1% AP  ASSESSMENT AND PLAN:  1.  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 No active FDG uptake - seen by EP 2/25 and amio stopped and Toprol  XL 50 bid started - Recommended continued PVC surveillance via ICD .  2. Anterior mediastinal calcified granulomas - stable by previous CT - Raises suspicion for previous sarcoid - PET scan 12/24 confirmed stability - no change  3. PAF - detected on ICD - subclinical. Longest episode 11hr 58 mins.  - CHADsVasc 1 -> decided against AC  4. Frequent PVCs - multifocal - previously suppressed on amio  - likely scar related. Doubt active sarcoid - mexilitene 150 bid   Signed, Toribio Fuel, MD  11/20/2023 8:50 PM  [facility] [address] Heart and Vascular [place] [address] [phone] (office) [phone] (fax)

## 2023-11-21 ENCOUNTER — Ambulatory Visit (HOSPITAL_COMMUNITY)
Admission: RE | Admit: 2023-11-21 | Discharge: 2023-11-21 | Disposition: A | Discharge status: Home / Self Care | Source: Ambulatory Visit | Attending: Internal Medicine | Admitting: Internal Medicine

## 2023-11-21 ENCOUNTER — Encounter (HOSPITAL_COMMUNITY): Payer: Self-pay | Admitting: Internal Medicine

## 2023-11-21 VITALS — BP 118/78 | HR 51 | Ht 69.0 in | Wt 192.2 lb

## 2023-11-21 DIAGNOSIS — Z8674 Personal history of sudden cardiac arrest: Secondary | ICD-10-CM | POA: Insufficient documentation

## 2023-11-21 DIAGNOSIS — Z4502 Encounter for adjustment and management of automatic implantable cardiac defibrillator: Secondary | ICD-10-CM | POA: Diagnosis not present

## 2023-11-21 DIAGNOSIS — I480 Paroxysmal atrial fibrillation: Secondary | ICD-10-CM | POA: Diagnosis not present

## 2023-11-21 DIAGNOSIS — I493 Ventricular premature depolarization: Secondary | ICD-10-CM | POA: Insufficient documentation

## 2023-11-21 DIAGNOSIS — I469 Cardiac arrest, cause unspecified: Secondary | ICD-10-CM

## 2023-11-21 DIAGNOSIS — Z79899 Other long term (current) drug therapy: Secondary | ICD-10-CM | POA: Diagnosis not present

## 2023-11-21 DIAGNOSIS — I5022 Chronic systolic (congestive) heart failure: Secondary | ICD-10-CM | POA: Diagnosis not present

## 2023-11-21 MED ORDER — MEXILETINE HCL 150 MG PO CAPS: 150.0000 mg | ORAL_CAPSULE | Freq: Two times a day (BID) | ORAL | 6 refills | Status: AC

## 2023-11-21 NOTE — Patient Instructions (Signed)
 Medication Changes:  START Mexilitene 150 mg Twice daily    Special Instructions // Education:  Do the following things EVERYDAY: Weigh yourself in the morning before breakfast. Write it down and keep it in a log. Take your medicines as prescribed Eat low salt foods--Limit salt (sodium) to 2000 mg per day.  Stay as active as you can everyday Limit all fluids for the day to less than 2 liters   Follow-Up in: 2-3 months (February 2026), **PLEASE CALL OUR OFFICE IN [place] TO SCHEDULE THIS APPOINTMENT   At the [facility], you and your health needs are our priority. We have a designated team specialized in the treatment of Heart Failure. This Care Team includes your primary Heart Failure Specialized Cardiologist (physician), Advanced Practice Providers (APPs- Physician Assistants and Nurse Practitioners), and Pharmacist who all work together to provide you with the care you need, when you need it.   You may see any of the following providers on your designated Care Team at your next follow up:  Dr. Toribio Fuel Dr. Ezra Shuck Dr. Odis Brownie Greig Mosses, NP Caffie Shed, [place] [facility], [place] Beckey Coe, NP Jordan Lee, NP Tinnie Redman, PharmD   Please be sure to bring in all your medications bottles to every appointment.   Need to Contact Us :  If you have any questions or concerns before your next appointment please send us  a message through [place] or call our office at [phone].    TO LEAVE A MESSAGE FOR THE NURSE SELECT OPTION 2, PLEASE LEAVE A MESSAGE INCLUDING: YOUR NAME DATE OF BIRTH CALL BACK NUMBER REASON FOR CALL**this is important as we prioritize the call backs  YOU WILL RECEIVE A CALL BACK THE SAME DAY AS LONG AS YOU CALL BEFORE 4:00 PM

## 2023-11-23 ENCOUNTER — Telehealth: Payer: Self-pay

## 2023-11-23 NOTE — Telephone Encounter (Addendum)
 The transmission was normal. No alerts and lead trends are stable. Do note slight increase on RV threshold 11/15/23 apt (1.5 v @ 0.6 ms).  Patient reports the thumping is not consistent and does not happen at night. Spoke to [place] w/ MDT who verified auto testing occurs at 1:00 AM.   Patient RA paces 11.2%, RV 0.3%.   Would you like us  to bring patient into device clinic to see if thumping is reproducible?

## 2023-11-23 NOTE — Telephone Encounter (Signed)
-----   Message from Fonda Kitty sent at 11/23/2023  7:46 AM EST ----- Regarding: Remote check Can we do a remote interrogation on this guy's device this morning? He felt a thumping sensation in his left chest last night around 8:30pm. Also, as part of this, can we find out what time his Medtronic device is programmed to do its daily threshold testing?  Josh

## 2023-11-23 NOTE — Telephone Encounter (Signed)
 Transmission received. Normal device function. Presenting rhythm, AS/VS 55 bpm.   RA Capture Management is programmed ADAPTIVE which possibly could relate. I have asked the patient to advise more about particular time frames.

## 2023-11-25 ENCOUNTER — Ambulatory Visit

## 2023-11-25 ENCOUNTER — Ambulatory Visit: Admitting: Cardiology

## 2023-11-25 DIAGNOSIS — I5022 Chronic systolic (congestive) heart failure: Secondary | ICD-10-CM | POA: Diagnosis not present

## 2023-11-27 ENCOUNTER — Ambulatory Visit: Payer: Self-pay | Admitting: Cardiology

## 2023-11-27 LAB — CUP PACEART REMOTE DEVICE CHECK
Battery Remaining Longevity: 139 mo
Battery Voltage: 3.01 V
Brady Statistic RV Percent Paced: 0.05 %
Date Time Interrogation Session: 20251113210332
HighPow Impedance: 61 Ohm
Implantable Lead Connection Status: 753985
Implantable Lead Connection Status: 753985
Implantable Lead Implant Date: 20241115
Implantable Lead Implant Date: 20241115
Implantable Lead Location: 753859
Implantable Lead Location: 753860
Implantable Lead Model: 5076
Implantable Pulse Generator Implant Date: 20241115
Lead Channel Impedance Value: 228 Ohm
Lead Channel Impedance Value: 304 Ohm
Lead Channel Impedance Value: 437 Ohm
Lead Channel Pacing Threshold Amplitude: 0.5 V
Lead Channel Pacing Threshold Amplitude: 1.875 V
Lead Channel Pacing Threshold Pulse Width: 0.4 ms
Lead Channel Pacing Threshold Pulse Width: 0.4 ms
Lead Channel Sensing Intrinsic Amplitude: 11.8 mV
Lead Channel Sensing Intrinsic Amplitude: 2.8 mV
Lead Channel Setting Pacing Amplitude: 1.5 V
Lead Channel Setting Pacing Amplitude: 3 V
Lead Channel Setting Pacing Pulse Width: 0.6 ms
Lead Channel Setting Sensing Sensitivity: 0.3 mV
Zone Setting Status: 755011
Zone Setting Status: 755011
Zone Setting Status: 755011

## 2023-11-28 NOTE — Progress Notes (Signed)
 Remote ICD Transmission

## 2023-12-14 DIAGNOSIS — L280 Lichen simplex chronicus: Secondary | ICD-10-CM | POA: Diagnosis not present

## 2023-12-14 DIAGNOSIS — D1801 Hemangioma of skin and subcutaneous tissue: Secondary | ICD-10-CM | POA: Diagnosis not present

## 2023-12-14 DIAGNOSIS — L814 Other melanin hyperpigmentation: Secondary | ICD-10-CM | POA: Diagnosis not present

## 2023-12-14 DIAGNOSIS — L821 Other seborrheic keratosis: Secondary | ICD-10-CM | POA: Diagnosis not present

## 2023-12-19 ENCOUNTER — Encounter: Payer: Self-pay | Admitting: Internal Medicine

## 2024-02-24 ENCOUNTER — Ambulatory Visit

## 2024-02-27 ENCOUNTER — Ambulatory Visit (HOSPITAL_COMMUNITY): Admitting: Internal Medicine

## 2024-05-25 ENCOUNTER — Ambulatory Visit

## 2024-08-24 ENCOUNTER — Ambulatory Visit

## 2024-11-23 ENCOUNTER — Ambulatory Visit
